# Patient Record
Sex: Female | Born: 1995 | Race: Black or African American | Hispanic: No | Marital: Single | Smoking: Never smoker
Health system: Southern US, Community
[De-identification: ages and names within clinical notes are randomized; demographics above are authoritative.]

## PROBLEM LIST (undated history)

## (undated) ENCOUNTER — Emergency Department (HOSPITAL_COMMUNITY): Admission: EM | Payer: Medicaid Other | Source: Home / Self Care

## (undated) DIAGNOSIS — Z789 Other specified health status: Secondary | ICD-10-CM

## (undated) DIAGNOSIS — Z6835 Body mass index (BMI) 35.0-35.9, adult: Secondary | ICD-10-CM

## (undated) DIAGNOSIS — Z8669 Personal history of other diseases of the nervous system and sense organs: Secondary | ICD-10-CM

## (undated) DIAGNOSIS — G932 Benign intracranial hypertension: Secondary | ICD-10-CM

## (undated) DIAGNOSIS — O139 Gestational [pregnancy-induced] hypertension without significant proteinuria, unspecified trimester: Secondary | ICD-10-CM

## (undated) HISTORY — PX: WISDOM TOOTH EXTRACTION: SHX21

## (undated) HISTORY — DX: Personal history of other diseases of the nervous system and sense organs: Z86.69

## (undated) HISTORY — DX: Body mass index (BMI) 35.0-35.9, adult: Z68.35

---

## 2020-05-24 ENCOUNTER — Emergency Department (HOSPITAL_COMMUNITY)
Admission: EM | Admit: 2020-05-24 | Discharge: 2020-05-24 | Disposition: A | Discharge status: Home / Self Care | Payer: BC Managed Care – PPO | Attending: Emergency Medicine | Admitting: Emergency Medicine

## 2020-05-24 ENCOUNTER — Encounter (HOSPITAL_COMMUNITY): Payer: Self-pay

## 2020-05-24 ENCOUNTER — Other Ambulatory Visit: Payer: Self-pay

## 2020-05-24 DIAGNOSIS — R509 Fever, unspecified: Secondary | ICD-10-CM | POA: Diagnosis present

## 2020-05-24 DIAGNOSIS — U071 COVID-19: Secondary | ICD-10-CM | POA: Insufficient documentation

## 2020-05-24 LAB — CBC WITH DIFFERENTIAL/PLATELET
Abs Immature Granulocytes: 0.02 10*3/uL (ref 0.00–0.07)
Basophils Absolute: 0 10*3/uL (ref 0.0–0.1)
Basophils Relative: 0 %
Eosinophils Absolute: 0 10*3/uL (ref 0.0–0.5)
Eosinophils Relative: 0 %
HCT: 37.6 % (ref 36.0–46.0)
Hemoglobin: 12.1 g/dL (ref 12.0–15.0)
Immature Granulocytes: 0 %
Lymphocytes Relative: 35 %
Lymphs Abs: 2.2 10*3/uL (ref 0.7–4.0)
MCH: 25.9 pg — ABNORMAL LOW (ref 26.0–34.0)
MCHC: 32.2 g/dL (ref 30.0–36.0)
MCV: 80.5 fL (ref 80.0–100.0)
Monocytes Absolute: 1.1 10*3/uL — ABNORMAL HIGH (ref 0.1–1.0)
Monocytes Relative: 17 %
Neutro Abs: 3 10*3/uL (ref 1.7–7.7)
Neutrophils Relative %: 48 %
Platelets: 279 10*3/uL (ref 150–400)
RBC: 4.67 MIL/uL (ref 3.87–5.11)
RDW: 14.2 % (ref 11.5–15.5)
WBC: 6.3 10*3/uL (ref 4.0–10.5)
nRBC: 0 % (ref 0.0–0.2)

## 2020-05-24 LAB — COMPREHENSIVE METABOLIC PANEL
ALT: 21 U/L (ref 0–44)
AST: 24 U/L (ref 15–41)
Albumin: 3.7 g/dL (ref 3.5–5.0)
Alkaline Phosphatase: 64 U/L (ref 38–126)
Anion gap: 9 (ref 5–15)
BUN: 9 mg/dL (ref 6–20)
CO2: 26 mmol/L (ref 22–32)
Calcium: 8.8 mg/dL — ABNORMAL LOW (ref 8.9–10.3)
Chloride: 101 mmol/L (ref 98–111)
Creatinine, Ser: 0.83 mg/dL (ref 0.44–1.00)
GFR, Estimated: >60 mL/min (ref >60)
Glucose, Bld: 92 mg/dL (ref 70–99)
Potassium: 3.7 mmol/L (ref 3.5–5.1)
Sodium: 136 mmol/L (ref 135–145)
Total Bilirubin: 0.5 mg/dL (ref 0.3–1.2)
Total Protein: 6.7 g/dL (ref 6.5–8.1)

## 2020-05-24 LAB — URINALYSIS, ROUTINE W REFLEX MICROSCOPIC
Bilirubin Urine: NEGATIVE
Glucose, UA: NEGATIVE mg/dL
Hgb urine dipstick: NEGATIVE
Ketones, ur: NEGATIVE mg/dL
Leukocytes,Ua: NEGATIVE
Nitrite: NEGATIVE
Protein, ur: NEGATIVE mg/dL
Specific Gravity, Urine: 1.009 (ref 1.005–1.030)
pH: 6 (ref 5.0–8.0)

## 2020-05-24 LAB — LIPASE, BLOOD: Lipase: 39 U/L (ref 11–51)

## 2020-05-24 LAB — RESP PANEL BY RT-PCR (FLU A&B, COVID) ARPGX2
Influenza A by PCR: NEGATIVE
Influenza B by PCR: NEGATIVE
SARS Coronavirus 2 by RT PCR: POSITIVE — AB

## 2020-05-24 LAB — I-STAT BETA HCG BLOOD, ED (MC, WL, AP ONLY): I-stat hCG, quantitative: <5 m[IU]/mL (ref <5)

## 2020-05-24 NOTE — ED Triage Notes (Addendum)
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

## 2020-05-24 NOTE — ED Provider Notes (Signed)
[facility] EMERGENCY DEPARTMENT Provider Note   CSN: [id number] Arrival date & time: 05/24/20  0011     History Chief Complaint  Patient presents with  . Fever  . Diarrhea    Adelis Docter is a 24 y.o. female.  HPI   Patient is a 24 year old female who presents to the emergency department complaining of a cough that started about 2 days ago. Patient reports associated fatigue, body aches, subjective fevers, rhinorrhea, sore throat, diarrhea. She has not been vaccinated for COVID-19. Reports very mild shortness of breath when exerting herself but otherwise denies any shortness of breath at rest. No chest pain. No vomiting or abdominal pain. 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     No past medical history on file.  There are no problems to display for this patient.   OB History   No obstetric history on file.     No family history on file.  Social History   Tobacco Use  . Smoking status: Never Smoker  . Smokeless tobacco: Never Used  Substance Use Topics  . Alcohol use: Never  . Drug use: Never    Home Medications Prior to Admission medications   Not on File    Allergies    Patient has no known allergies.  Review of Systems   Review of Systems  All other systems reviewed and are negative. Ten systems reviewed and are negative for acute change, except as noted in the HPI.   Physical Exam Updated Vital Signs BP 124/79 (BP Location: Left Arm)   Pulse (!) 103   Temp 99.6 F (37.6 C) (Oral)   Resp 18   Ht 5' 3.5" (1.613 m)   Wt 92.5 kg   LMP 05/18/2020   SpO2 100%   BMI 35.57 kg/m   Physical Exam Vitals and nursing note reviewed.  Constitutional:      General: She is not in acute distress.    Appearance: Normal appearance. She is normal weight. She is not ill-appearing, toxic-appearing or diaphoretic.  HENT:     Head: Normocephalic and atraumatic.     Right Ear: External ear normal.     Left Ear: External ear normal.      Nose: Congestion present.     Mouth/Throat:     Mouth: Mucous membranes are moist.     Pharynx: Oropharynx is clear. No oropharyngeal exudate or posterior oropharyngeal erythema.     Comments: Uvula midline. No significant erythema noted in the posterior oropharynx. Readily handling secretions. No hot potato voice. Eyes:     Extraocular Movements: Extraocular movements intact.  Cardiovascular:     Rate and Rhythm: Normal rate and regular rhythm.     Pulses: Normal pulses.     Heart sounds: Normal heart sounds. No murmur heard. No friction rub. No gallop.      Comments: RRR without M/R/G. Pulmonary:     Effort: Pulmonary effort is normal. No respiratory distress.     Breath sounds: Normal breath sounds. No stridor. No wheezing, rhonchi or rales.     Comments: LCTAB Abdominal:     General: Abdomen is flat.     Palpations: Abdomen is soft.     Tenderness: There is no abdominal tenderness.     Comments: Abdomen is soft and nontender in all 4 quadrants.  Musculoskeletal:        General: Normal range of motion.     Cervical back: Normal range of motion and neck supple. No tenderness.  Skin:  General: Skin is warm and dry.  Neurological:     General: No focal deficit present.     Mental Status: She is alert and oriented to person, place, and time.  Psychiatric:        Mood and Affect: Mood normal.        Behavior: Behavior normal.    ED Results / Procedures / Treatments   Labs (all labs ordered are listed, but only abnormal results are displayed) Labs Reviewed  RESP PANEL BY RT-PCR (FLU A&B, COVID) ARPGX2 - Abnormal; Notable for the following components:      Result Value   SARS Coronavirus 2 by RT PCR POSITIVE (*)    All other components within normal limits  COMPREHENSIVE METABOLIC PANEL - Abnormal; Notable for the following components:   Calcium 8.8 (*)    All other components within normal limits  CBC WITH DIFFERENTIAL/PLATELET - Abnormal; Notable for the following  components:   MCH 25.9 (*)    Monocytes Absolute 1.1 (*)    All other components within normal limits  LIPASE, BLOOD  URINALYSIS, ROUTINE W REFLEX MICROSCOPIC  I-STAT BETA HCG BLOOD, ED ([facility], [facility], [facility] ONLY)   EKG None  Radiology No results found.  Procedures Procedures (including critical care time)  Medications Ordered in ED Medications - No data to display  ED Course  I have reviewed the triage vital signs and the nursing notes.  Pertinent labs & imaging results that were available during my care of the patient were reviewed by me and considered in my medical decision making (see chart for details).  Clinical Course as of 05/24/20 0236  Sat May 24, 2020  0223 SARS Coronavirus 2 by RT PCR(!): POSITIVE [LJ]    Clinical Course User Index [LJ] Placido Sou, PA-C   MDM Rules/Calculators/A&P                          Patient is a 24 year old female that presents the emergency department with a multitude of symptoms consistent with COVID-19. She has not been vaccinated for COVID-19. Her test today was positive. She is on day 2 of her symptoms.  Remaining lab work was all obtained in triage and is reassuring. CBC without leukocytosis. No elevation in lipase. UA benign. Electrolytes within normal limits on CMP. Vital signs are stable. She is afebrile as well as not tachycardic. No hypoxia. Normotensive.  Feel that patient is safe for discharge at this time. We discussed quarantining for 10 to 14 days from symptom onset. Tylenol for management of body aches and fever. I have reached out to the MAB clinic regarding the patient and they should be reaching out to her to schedule a time for MAB infusion. Patient is amenable with this plan and verbalized understanding of the above plan. Her questions were answered and she was amicable at the time of discharge.  Final Clinical Impression(s) / ED Diagnoses Final diagnoses:  COVID-19   Rx / DC Orders ED Discharge Orders    None        Placido Sou, PA-C 05/24/20 0236    Mesner, Barbara Cower, MD 05/24/20 [phone]

## 2020-05-24 NOTE — ED Notes (Signed)
Pt denies chest pain. Pt reports being sob on exertion.

## 2020-05-24 NOTE — Discharge Instructions (Signed)
Like we discussed, you can continue take Tylenol for management of your body aches and fevers. Please follow the instructions on the bottle. Do not exceed more than 3000 mg of Tylenol in 1 day.  Please make sure you are staying adequately hydrated. Make sure that you quarantine for 10 to 14 days from onset of your symptoms. I would recommend getting a repeat Covid test to be sure that you were negative before ending your quarantine time.  I have reached out to the monoclonal antibody infusion clinic regarding your case. They will probably reach out to you early next week regarding getting the monoclonal antibodies. I highly recommend that you do so. These been proven to help with the COVID-19 infection.  If you develop worsening symptoms that we discussed, please return to the emergency department for reevaluation. It was a pleasure to meet you.

## 2020-05-24 NOTE — ED Notes (Signed)
Patient verbalized understanding of discharge instructions. Opportunity for questions and answers.  

## 2020-05-25 ENCOUNTER — Encounter: Payer: Self-pay | Admitting: Physician Assistant

## 2020-05-25 ENCOUNTER — Telehealth: Payer: Self-pay | Admitting: Physician Assistant

## 2020-05-25 DIAGNOSIS — Z6835 Body mass index (BMI) 35.0-35.9, adult: Secondary | ICD-10-CM | POA: Insufficient documentation

## 2020-05-25 NOTE — Telephone Encounter (Signed)
Called to discuss with patient about Covid symptoms and the use of sotrovimab, bamlanivimab/etesevimab or casirivimab/imdevimab, a monoclonal antibody infusion for those with mild to moderate Covid symptoms and at a high risk of hospitalization.  Pt is qualified for this infusion at the [facility] infusion center due to; Specific high risk criteria : BMI>25, high SVI. Unvaccinated.    Message left to call back our hotline [phone]. My chart message sent if active on Mychart.   Cline Crock PA-C

## 2020-11-20 ENCOUNTER — Encounter (HOSPITAL_COMMUNITY): Payer: Self-pay | Admitting: Emergency Medicine

## 2020-11-20 ENCOUNTER — Inpatient Hospital Stay (HOSPITAL_COMMUNITY)
Admission: EM | Admit: 2020-11-20 | Discharge: 2020-11-20 | Disposition: A | Discharge status: Home / Self Care | Payer: Medicaid Other | Attending: Family Medicine | Admitting: Family Medicine

## 2020-11-20 ENCOUNTER — Inpatient Hospital Stay (HOSPITAL_COMMUNITY): Payer: Medicaid Other

## 2020-11-20 ENCOUNTER — Other Ambulatory Visit: Payer: Self-pay

## 2020-11-20 DIAGNOSIS — Z3A08 8 weeks gestation of pregnancy: Secondary | ICD-10-CM

## 2020-11-20 DIAGNOSIS — O219 Vomiting of pregnancy, unspecified: Secondary | ICD-10-CM

## 2020-11-20 DIAGNOSIS — O210 Mild hyperemesis gravidarum: Secondary | ICD-10-CM | POA: Diagnosis not present

## 2020-11-20 DIAGNOSIS — R030 Elevated blood-pressure reading, without diagnosis of hypertension: Secondary | ICD-10-CM | POA: Insufficient documentation

## 2020-11-20 DIAGNOSIS — R102 Pelvic and perineal pain: Secondary | ICD-10-CM | POA: Diagnosis not present

## 2020-11-20 DIAGNOSIS — R109 Unspecified abdominal pain: Secondary | ICD-10-CM | POA: Diagnosis not present

## 2020-11-20 DIAGNOSIS — O26891 Other specified pregnancy related conditions, first trimester: Secondary | ICD-10-CM | POA: Insufficient documentation

## 2020-11-20 DIAGNOSIS — O3680X0 Pregnancy with inconclusive fetal viability, not applicable or unspecified: Secondary | ICD-10-CM

## 2020-11-20 LAB — HCG, QUANTITATIVE, PREGNANCY: hCG, Beta Chain, Quant, S: 116913 m[IU]/mL — ABNORMAL HIGH (ref <5)

## 2020-11-20 LAB — CBC
HCT: 39.8 % (ref 36.0–46.0)
Hemoglobin: 12.2 g/dL (ref 12.0–15.0)
MCH: 25.9 pg — ABNORMAL LOW (ref 26.0–34.0)
MCHC: 30.7 g/dL (ref 30.0–36.0)
MCV: 84.5 fL (ref 80.0–100.0)
Platelets: 282 10*3/uL (ref 150–400)
RBC: 4.71 MIL/uL (ref 3.87–5.11)
RDW: 15.3 % (ref 11.5–15.5)
WBC: 15.1 10*3/uL — ABNORMAL HIGH (ref 4.0–10.5)
nRBC: 0 % (ref 0.0–0.2)

## 2020-11-20 LAB — URINALYSIS, ROUTINE W REFLEX MICROSCOPIC
Bilirubin Urine: NEGATIVE
Glucose, UA: NEGATIVE mg/dL
Hgb urine dipstick: NEGATIVE
Ketones, ur: 5 mg/dL — AB
Leukocytes,Ua: NEGATIVE
Nitrite: NEGATIVE
Protein, ur: NEGATIVE mg/dL
Specific Gravity, Urine: 1.021 (ref 1.005–1.030)
pH: 5 (ref 5.0–8.0)

## 2020-11-20 LAB — WET PREP, GENITAL
Sperm: NONE SEEN
Trich, Wet Prep: NONE SEEN
Yeast Wet Prep HPF POC: NONE SEEN

## 2020-11-20 LAB — COMPREHENSIVE METABOLIC PANEL
ALT: 25 U/L (ref 0–44)
AST: 25 U/L (ref 15–41)
Albumin: 3.7 g/dL (ref 3.5–5.0)
Alkaline Phosphatase: 62 U/L (ref 38–126)
Anion gap: 6 (ref 5–15)
BUN: 8 mg/dL (ref 6–20)
CO2: 24 mmol/L (ref 22–32)
Calcium: 9.2 mg/dL (ref 8.9–10.3)
Chloride: 103 mmol/L (ref 98–111)
Creatinine, Ser: 0.61 mg/dL (ref 0.44–1.00)
GFR, Estimated: >60 mL/min (ref >60)
Glucose, Bld: 102 mg/dL — ABNORMAL HIGH (ref 70–99)
Potassium: 3.6 mmol/L (ref 3.5–5.1)
Sodium: 133 mmol/L — ABNORMAL LOW (ref 135–145)
Total Bilirubin: 0.2 mg/dL — ABNORMAL LOW (ref 0.3–1.2)
Total Protein: 6.6 g/dL (ref 6.5–8.1)

## 2020-11-20 LAB — GC/CHLAMYDIA PROBE AMP (~~LOC~~) NOT AT ARMC
Chlamydia: NEGATIVE
Comment: NEGATIVE
Comment: NORMAL
Neisseria Gonorrhea: NEGATIVE

## 2020-11-20 LAB — I-STAT BETA HCG BLOOD, ED (MC, WL, AP ONLY): I-stat hCG, quantitative: >2000 m[IU]/mL — ABNORMAL HIGH (ref <5)

## 2020-11-20 IMAGING — US US OB < 14 WEEKS - US OB TV
1 series · 15 of 28 positions shown · non-contrast
Comparison: None.

CLINICAL DATA: Pelvic pain.  Pregnancy of unknown location.

EXAM:
OBSTETRIC <14 WK US AND TRANSVAGINAL OB US
TECHNIQUE: Both transabdominal and transvaginal ultrasound examinations were
performed for complete evaluation of the gestation as well as the
maternal uterus, adnexal regions, and pelvic cul-de-sac.
Transvaginal technique was performed to assess early pregnancy.

[Series 1: us ob < 14 weeks - us ob tv · 15 of 54 slices shown]
[im 1/54]
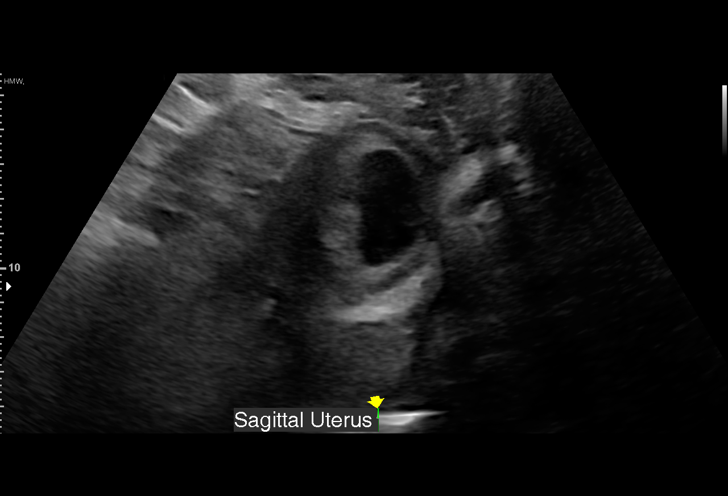
[im 4/54]
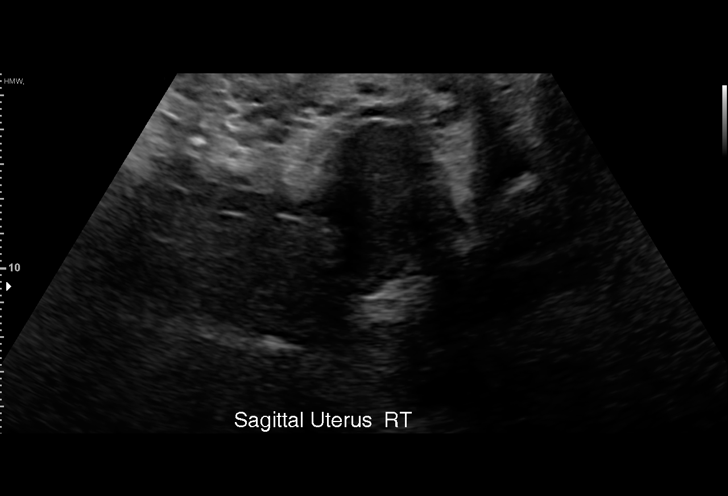
[im 8/54]
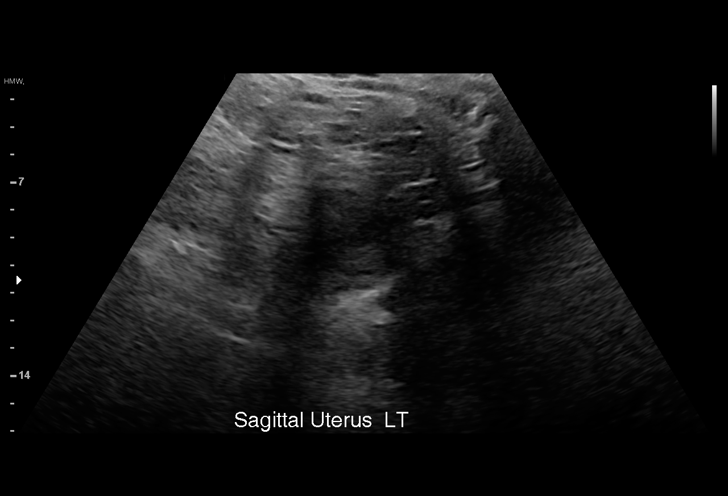
[im 12/54]
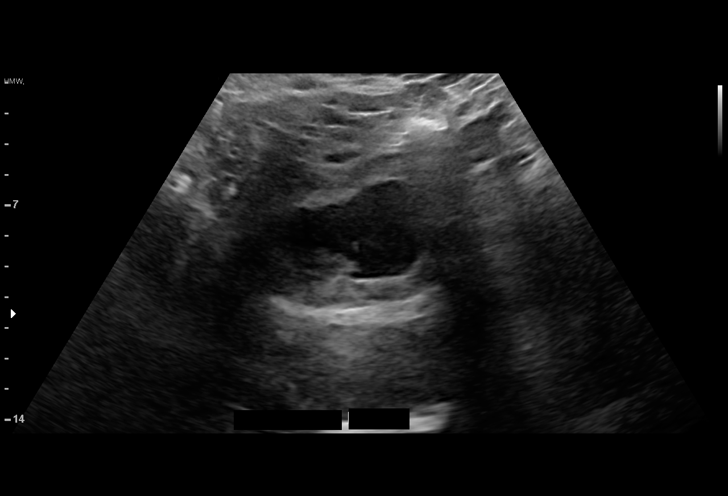
[im 16/54]
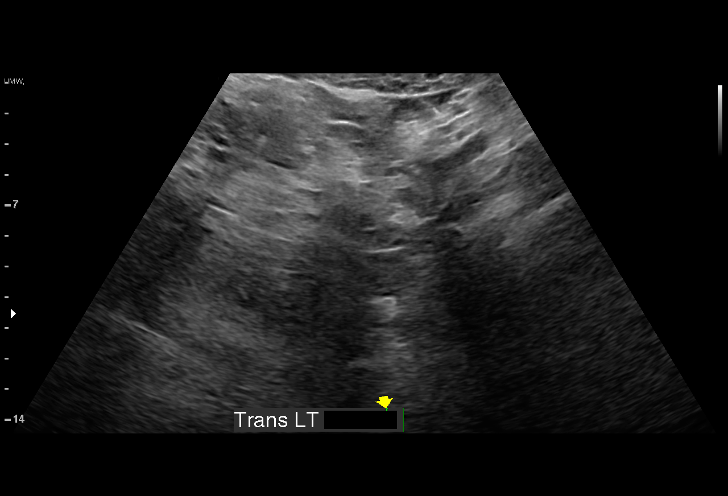
[im 20/54]
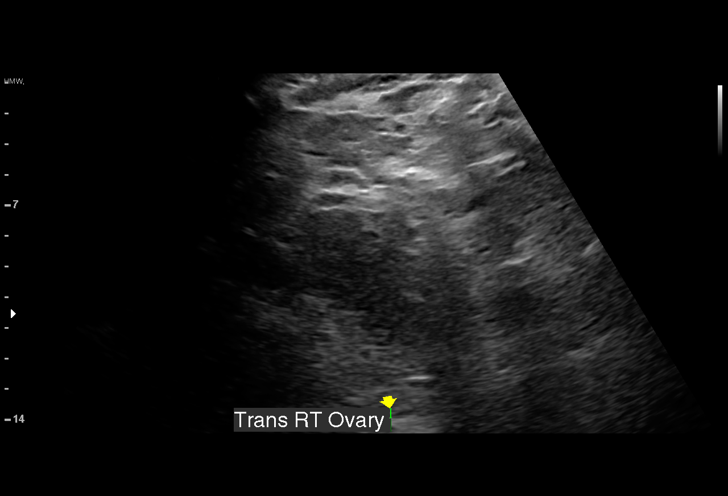
[im 24/54]
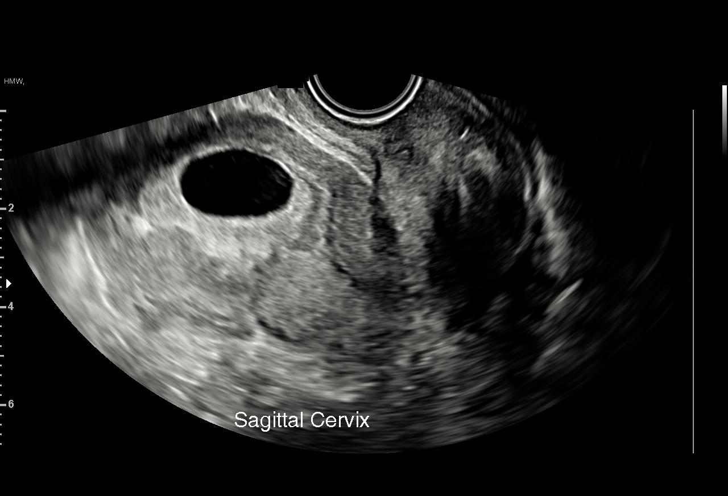
[im 28/54]
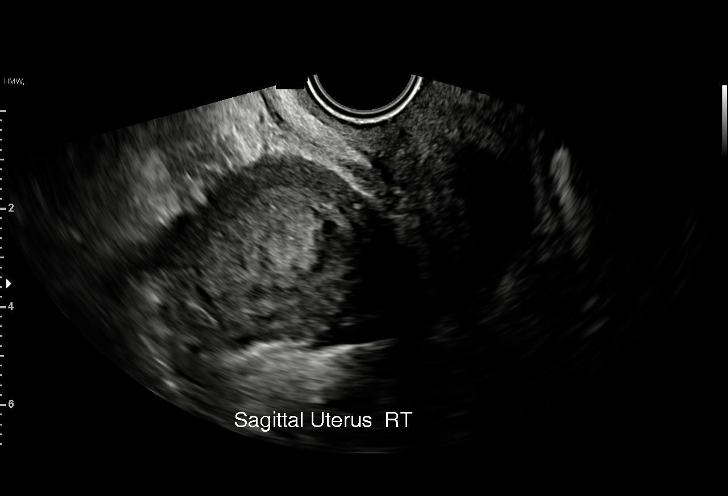
[im 30/54]
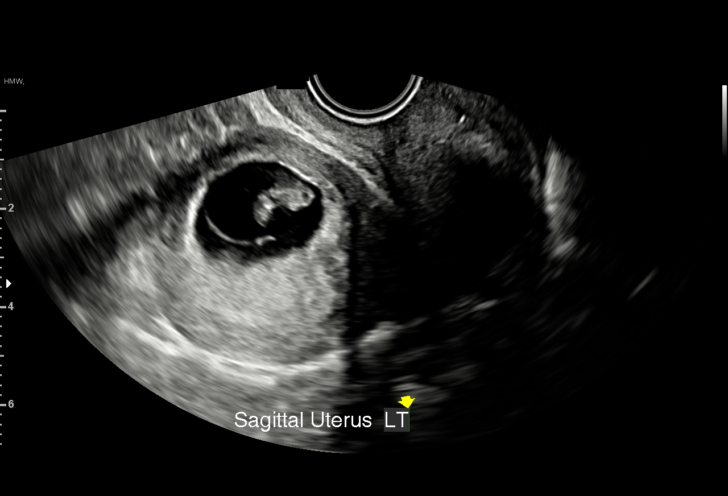
[im 34/54]
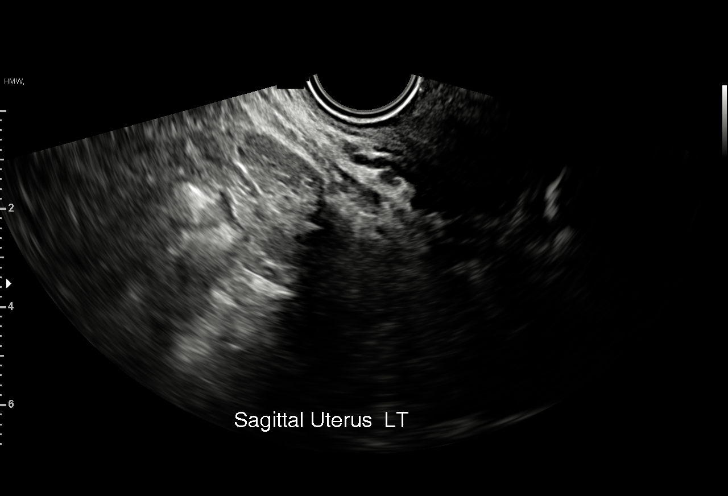
[im 38/54]
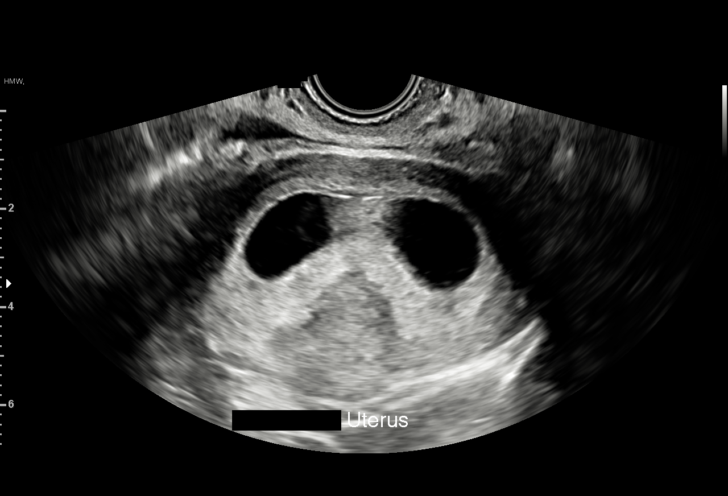
[im 42/54]
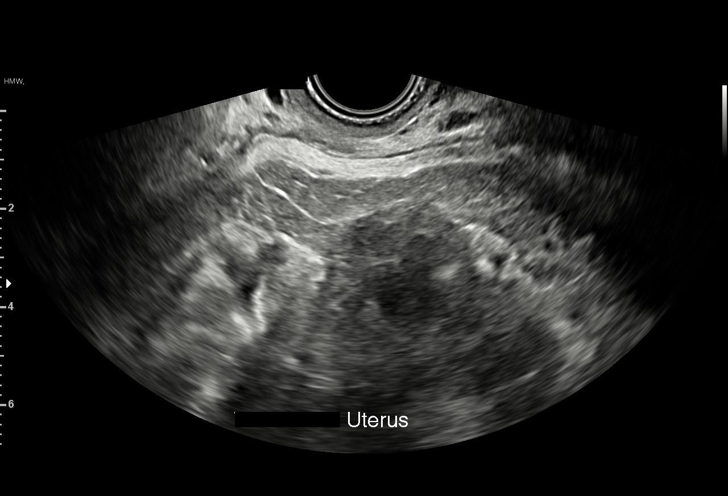
[im 46/54]
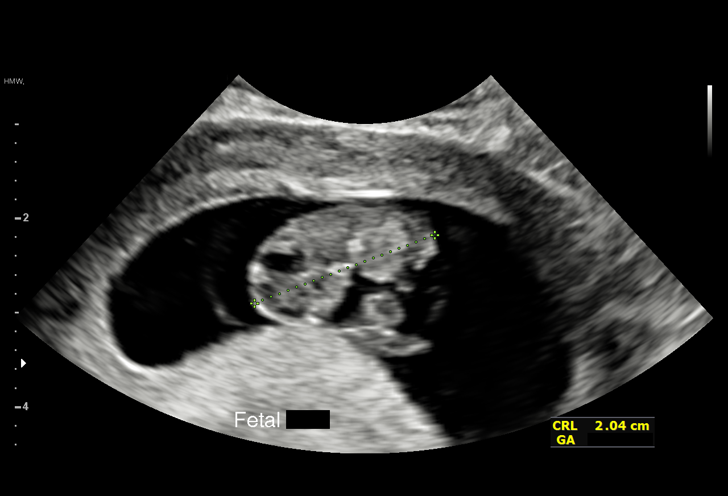
[im 50/54]
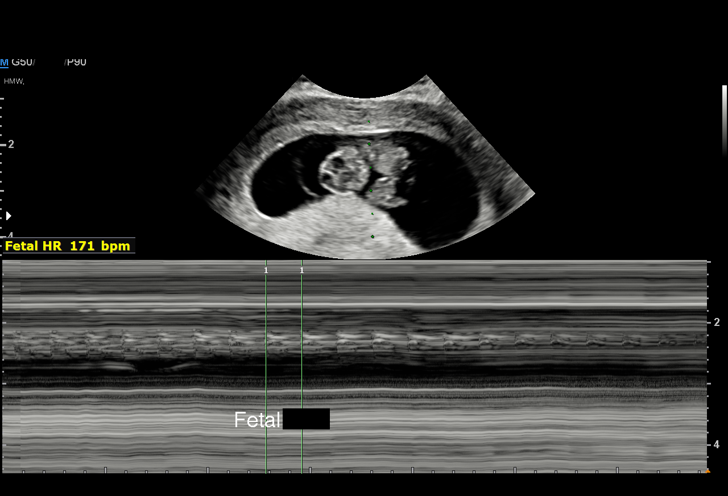
[im 54/54]
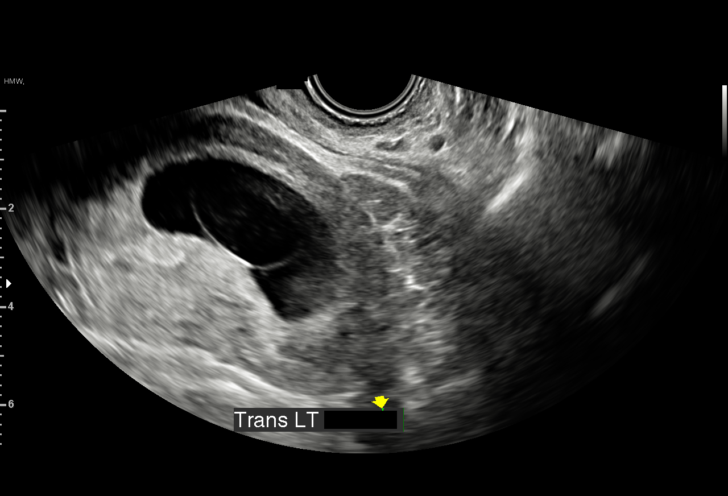

[15 of 28 positions shown; findings below may reference images not displayed]

FINDINGS: Intrauterine gestational sac: Single

Yolk sac:  Visualized.

Embryo:  Visualized.

Cardiac Activity: Visualized.

Heart Rate: 171 bpm

CRL:  20.8 mm   8 w   4 d                  US EDC: [DATE]

Subchorionic hemorrhage:  None visualized.

Maternal uterus/adnexae: Unremarkable
IMPRESSION: Single living intrauterine pregnancy measuring 8 weeks 4 days. No
abnormal finding.

## 2020-11-20 MED ORDER — DOXYLAMINE-PYRIDOXINE 10-10 MG PO TBEC: 2.0000 | DELAYED_RELEASE_TABLET | Freq: Every evening | ORAL | 0 refills | Status: DC | PRN

## 2020-11-20 MED ORDER — ONDANSETRON 4 MG PO TBDP: 4.0000 mg | ORAL_TABLET | Freq: Four times a day (QID) | ORAL | 0 refills | Status: AC | PRN

## 2020-11-20 MED ORDER — ONDANSETRON 4 MG PO TBDP
4.0000 mg | ORAL_TABLET | Freq: Once | ORAL | Status: AC
  Administered 2020-11-20: 4 mg via ORAL
  Filled 2020-11-20: qty 1

## 2020-11-20 NOTE — MAU Note (Signed)
Reports nausea for the past 3 weeks. Has only vomited 4 times in the 3 weeks. Denies vaginal bleeding or leaking of fluid. Occasional abdominal cramping, none now.

## 2020-11-20 NOTE — MAU Provider Note (Signed)
Chief Complaint: Emesis and Nausea   Event Date/Time   First Provider Initiated Contact with Patient 11/20/20 0457        SUBJECTIVE HPI: Tracy Keller is a 25 y.o. G1P0 at [redacted]w[redacted]d by LMP who presents to maternity admissions reporting nausea for several weeks.  Has not vomited much and can keep down fluids and "dry food".  Has intermittent pelvic cramping, alternating sides. . She denies vaginal bleeding, vaginal itching/burning, urinary symptoms, h/a, dizziness, or fever/chills.    Emesis  This is a recurrent problem. The current episode started 1 to 4 weeks ago. The problem occurs intermittently. The problem has been unchanged. There has been no fever. Associated symptoms include abdominal pain. Pertinent negatives include no chest pain, chills, diarrhea, dizziness, fever, headaches, myalgias or weight loss. She has tried nothing for the symptoms.  Abdominal Pain This is a recurrent problem. The current episode started 1 to 4 weeks ago. The onset quality is gradual. The problem occurs intermittently. The problem is unchanged. The pain is located in the suprapubic region, LLQ and RLQ. The pain is mild. The quality of the pain is described as cramping. The pain does not radiate. Associated symptoms include vomiting. Pertinent negatives include no constipation, diarrhea, fever, frequency, headaches or myalgias. Nothing relieves the symptoms. Past treatments include nothing.   RN Note: Reports nausea for the past 3 weeks. Has only vomited 4 times in the 3 weeks. Denies vaginal bleeding or leaking of fluid. Occasional abdominal cramping, none now.   Past Medical History:  Diagnosis Date   BMI 35.0-35.9,adult    History reviewed. No pertinent surgical history. Social History   Socioeconomic History   Marital status: Single    Spouse name: Not on file   Number of children: Not on file   Years of education: Not on file   Highest education level: Not on file  Occupational History   Not on file   Tobacco Use   Smoking status: Never   Smokeless tobacco: Never  Substance and Sexual Activity   Alcohol use: Never   Drug use: Never   Sexual activity: Never  Other Topics Concern   Not on file  Social History Narrative   Not on file   Social Determinants of Health   Financial Resource Strain: Not on file  Food Insecurity: Not on file  Transportation Needs: Not on file  Physical Activity: Not on file  Stress: Not on file  Social Connections: Not on file  Intimate Partner Violence: Not on file   No current facility-administered medications on file prior to encounter.   Current Outpatient Medications on File Prior to Encounter  Medication Sig Dispense Refill   Prenatal Vit-Fe Fumarate-FA (PRENATAL MULTIVITAMIN) TABS tablet Take 1 tablet by mouth daily at 12 noon.     Ascorbic Acid (VITAMIN C ADULT GUMMIES PO) Take 1 tablet by mouth daily.     guaiFENesin (MUCINEX) 600 MG 12 hr tablet Take 600 mg by mouth 2 (two) times daily as needed for cough.     Norgestimate-Ethinyl Estradiol Triphasic 0.18/0.215/0.25 MG-35 MCG tablet Take 1 tablet by mouth daily.     No Known Allergies  I have reviewed patient's Past Medical Hx, Surgical Hx, Family Hx, Social Hx, medications and allergies.   ROS:  Review of Systems  Constitutional:  Negative for chills, fever and weight loss.  Cardiovascular:  Negative for chest pain.  Gastrointestinal:  Positive for abdominal pain and vomiting. Negative for constipation and diarrhea.  Genitourinary:  Negative for frequency.  Musculoskeletal:  Negative for myalgias.  Neurological:  Negative for dizziness and headaches.  Review of Systems  Other systems negative   Physical Exam  Physical Exam Patient Vitals for the past 24 hrs:  BP Temp Temp src Pulse Resp SpO2 Height Weight  11/20/20 0445 121/66 99 F (37.2 C) Oral 91 17 100 % 5\' 3"  (1.6 m) 102 kg  11/20/20 0444 -- -- -- -- -- 98 % -- --  11/20/20 0402 (!) 141/80 99.1 F (37.3 C) Oral 92 16  100 % -- --   Constitutional: Well-developed, well-nourished female in no acute distress.  Cardiovascular: normal rate Respiratory: normal effort GI: Abd soft, non-tender. No rebound or guarding MS: Extremities nontender, no edema, normal ROM Neurologic: Alert and oriented x 4.  GU: Neg CVAT.  PELVIC EXAM: nontender over bilateral pelvic areas.  Bimanual deferred for 11/22/20 ordered  LAB RESULTS Results for orders placed or performed during the hospital encounter of 11/20/20 (from the past 24 hour(s))  I-Stat beta hCG blood, ED     Status: Abnormal   Collection Time: 11/20/20  4:14 AM  Result Value Ref Range   I-stat hCG, quantitative >2,000.0 (H) <5 mIU/mL   Comment 3          Wet prep, genital     Status: Abnormal   Collection Time: 11/20/20  5:31 AM   Specimen: Urine, Clean Catch  Result Value Ref Range   Yeast Wet Prep HPF POC NONE SEEN NONE SEEN   Trich, Wet Prep NONE SEEN NONE SEEN   Clue Cells Wet Prep HPF POC PRESENT (A) NONE SEEN   WBC, Wet Prep HPF POC MANY (A) NONE SEEN   Sperm NONE SEEN   CBC     Status: Abnormal   Collection Time: 11/20/20  5:41 AM  Result Value Ref Range   WBC 15.1 (H) 4.0 - 10.5 K/uL   RBC 4.71 3.87 - 5.11 MIL/uL   Hemoglobin 12.2 12.0 - 15.0 g/dL   HCT 11/22/20 06.2 - 69.4 %   MCV 84.5 80.0 - 100.0 fL   MCH 25.9 (L) 26.0 - 34.0 pg   MCHC 30.7 30.0 - 36.0 g/dL   RDW 85.4 62.7 - 03.5 %   Platelets 282 150 - 400 K/uL   nRBC 0.0 0.0 - 0.2 %  Urinalysis, Routine w reflex microscopic Urine, Clean Catch     Status: Abnormal   Collection Time: 11/20/20  5:51 AM  Result Value Ref Range   Color, Urine YELLOW YELLOW   APPearance CLEAR CLEAR   Specific Gravity, Urine 1.021 1.005 - 1.030   pH 5.0 5.0 - 8.0   Glucose, UA NEGATIVE NEGATIVE mg/dL   Hgb urine dipstick NEGATIVE NEGATIVE   Bilirubin Urine NEGATIVE NEGATIVE   Ketones, ur 5 (A) NEGATIVE mg/dL   Protein, ur NEGATIVE NEGATIVE mg/dL   Nitrite NEGATIVE NEGATIVE   Leukocytes,Ua NEGATIVE NEGATIVE        IMAGING 11/22/20 OB LESS THAN 14 WEEKS WITH OB TRANSVAGINAL  Result Date: 11/20/2020 CLINICAL DATA:  Pelvic pain.  Pregnancy of unknown location. EXAM: OBSTETRIC <14 WK 11/22/2020 AND TRANSVAGINAL OB US TECHNIQUE: Both transabdominal and transvaginal ultrasound examinations were performed for complete evaluation of the gestation as well as the maternal uterus, adnexal regions, and pelvic cul-de-sac. Transvaginal technique was performed to assess early pregnancy. COMPARISON:  None. FINDINGS: Intrauterine gestational sac: Single Yolk sac:  Visualized. Embryo:  Visualized. Cardiac Activity: Visualized. Heart Rate: 171 bpm CRL:  20.8 mm   8 w  4 d                  [place] EDC: 06/28/2021 Subchorionic hemorrhage:  None visualized. Maternal uterus/adnexae: Unremarkable IMPRESSION: Single living intrauterine pregnancy measuring 8 weeks 4 days. No abnormal finding. Electronically Signed   By: Marnee Spring M.D.   On: 11/20/2020 05:55     MAU Management/MDM: Ordered usual first trimester r/o ectopic labs.   Pelvic cultures done Will check baseline Ultrasound to rule out ectopic.  This bleeding/pain can represent a normal pregnancy with bleeding, spontaneous abortion or even an ectopic which can be life-threatening.  The process as listed above helps to determine which of these is present.  Zofran given for nausea Since she has only vomited 4 times in past 3 weeks, no evidence for dehydration Reviewed results.  Normal Single IUP noted, size = dates  ASSESSMENT Pregnancy at [redacted]w[redacted]d Nausea and occasional vomiting Intermittent pelvic pain Borderline hypertension, likely chronic  PLAN Discharge home Rx Diclegis for nausea Rx Zofran only if diclegis does not work Keep appt at [facility]   Pt stable at time of discharge. Encouraged to return here if she develops worsening of symptoms, increase in pain, fever, or other concerning symptoms.    Wynelle Bourgeois CNM, MSN Certified Nurse-Midwife 11/20/2020  4:58  AM

## 2020-11-20 NOTE — ED Triage Notes (Signed)
Patient [redacted] weeks pregnant per health dept test, having 3 weeks of nausea and vomiting with some mild abdominal cramping.  No vaginal bleeding or discharge.

## 2020-11-20 NOTE — ED Provider Notes (Signed)
MSE was initiated and I personally evaluated the patient and placed orders (if any) at  4:07 AM on November 20, 2020.  Patient reports she is 8 weeks into her first pregnancy, confirmed by [facility] Department. First OB/GYN appt. July 1st. Reports vomiting for the past 3 weeks. No vaginal bleeding. She has lower abdominal cramping "every now and then". No fever.  Today's Vitals   11/20/20 0402 11/20/20 0407  BP: (!) 141/80   Pulse: 92   Resp: 16   Temp: 99.1 F (37.3 C)   TempSrc: Oral   SpO2: 100%   PainSc:  1    There is no height or weight on file to calculate BMI.  Patient in NAD Abd nontender  Discussed with MAU provider who accepts the patient for transfer.   The patient appears stable so that the remainder of the MSE may be completed by another provider.   Elpidio Anis, PA-C 11/20/20 0427    Dione Booze, MD 11/20/20 [phone]

## 2020-12-02 LAB — OB RESULTS CONSOLE HEPATITIS B SURFACE ANTIGEN: Hepatitis B Surface Ag: NEGATIVE

## 2020-12-02 LAB — OB RESULTS CONSOLE ANTIBODY SCREEN: Antibody Screen: NEGATIVE

## 2020-12-02 LAB — OB RESULTS CONSOLE GC/CHLAMYDIA
Chlamydia: NEGATIVE
Gonorrhea: NEGATIVE

## 2020-12-02 LAB — OB RESULTS CONSOLE ABO/RH: RH Type: NEGATIVE

## 2020-12-02 LAB — OB RESULTS CONSOLE HIV ANTIBODY (ROUTINE TESTING): HIV: NONREACTIVE

## 2020-12-02 LAB — OB RESULTS CONSOLE RPR: RPR: NONREACTIVE

## 2020-12-02 LAB — OB RESULTS CONSOLE RUBELLA ANTIBODY, IGM: Rubella: IMMUNE

## 2020-12-02 LAB — HEPATITIS C ANTIBODY: HCV Ab: NEGATIVE

## 2020-12-17 ENCOUNTER — Inpatient Hospital Stay (HOSPITAL_COMMUNITY)
Admission: AD | Admit: 2020-12-17 | Discharge: 2020-12-17 | Disposition: A | Discharge status: Home / Self Care | Payer: BC Managed Care – PPO | Attending: Obstetrics and Gynecology | Admitting: Obstetrics and Gynecology

## 2020-12-17 ENCOUNTER — Other Ambulatory Visit: Payer: Self-pay

## 2020-12-17 ENCOUNTER — Encounter (HOSPITAL_COMMUNITY): Payer: Self-pay | Admitting: Obstetrics and Gynecology

## 2020-12-17 DIAGNOSIS — O26891 Other specified pregnancy related conditions, first trimester: Secondary | ICD-10-CM

## 2020-12-17 DIAGNOSIS — Z3A12 12 weeks gestation of pregnancy: Secondary | ICD-10-CM

## 2020-12-17 DIAGNOSIS — Z79899 Other long term (current) drug therapy: Secondary | ICD-10-CM | POA: Insufficient documentation

## 2020-12-17 DIAGNOSIS — R109 Unspecified abdominal pain: Secondary | ICD-10-CM | POA: Diagnosis not present

## 2020-12-17 LAB — URINALYSIS, ROUTINE W REFLEX MICROSCOPIC
Bilirubin Urine: NEGATIVE
Glucose, UA: NEGATIVE mg/dL
Hgb urine dipstick: NEGATIVE
Ketones, ur: NEGATIVE mg/dL
Leukocytes,Ua: NEGATIVE
Nitrite: NEGATIVE
Protein, ur: NEGATIVE mg/dL
Specific Gravity, Urine: 1.025 (ref 1.005–1.030)
pH: 6 (ref 5.0–8.0)

## 2020-12-17 NOTE — MAU Provider Note (Signed)
Chief Complaint: Abdominal Cramping   Event Date/Time   First Provider Initiated Contact with Patient 12/17/20 0453      SUBJECTIVE HPI: Tracy Keller is a 25 y.o. G1P0 at [redacted]w[redacted]d by LMP c/w early [place] at [facility] per pt who presents to maternity admissions reporting cramping.  She reports the pain is less than menstrual cramping but it is persistent.  There are no other associated symptoms.     Location: lower abdomen Quality: cramping Severity: 3-4/10 on pain scale Duration: 1 day Timing: intermittent Modifying factors: none Associated signs and symptoms: none  HPI  Past Medical History:  Diagnosis Date   BMI 35.0-35.9,adult    History reviewed. No pertinent surgical history. Social History   Socioeconomic History   Marital status: Single    Spouse name: Not on file   Number of children: Not on file   Years of education: Not on file   Highest education level: Not on file  Occupational History   Not on file  Tobacco Use   Smoking status: Never   Smokeless tobacco: Never  Substance and Sexual Activity   Alcohol use: Never   Drug use: Never   Sexual activity: Yes  Other Topics Concern   Not on file  Social History Narrative   Not on file   Social Determinants of Health   Financial Resource Strain: Not on file  Food Insecurity: Not on file  Transportation Needs: Not on file  Physical Activity: Not on file  Stress: Not on file  Social Connections: Not on file  Intimate Partner Violence: Not on file   No current facility-administered medications on file prior to encounter.   Current Outpatient Medications on File Prior to Encounter  Medication Sig Dispense Refill   Prenatal Vit-Fe Fumarate-FA (PRENATAL MULTIVITAMIN) TABS tablet Take 1 tablet by mouth daily at 12 noon.     Ascorbic Acid (VITAMIN C ADULT GUMMIES PO) Take 1 tablet by mouth daily.     Doxylamine-Pyridoxine (DICLEGIS) 10-10 MG TBEC Take 2 tablets by mouth at bedtime as needed (nausea). 60 tablet 0    ondansetron (ZOFRAN ODT) 4 MG disintegrating tablet Take 1 tablet (4 mg total) by mouth every 6 (six) hours as needed for nausea. 20 tablet 0   No Known Allergies  ROS:  Review of Systems  Constitutional:  Negative for chills, fatigue and fever.  Respiratory:  Negative for shortness of breath.   Cardiovascular:  Negative for chest pain.  Gastrointestinal:  Positive for abdominal pain. Negative for nausea and vomiting.  Genitourinary:  Negative for difficulty urinating, dysuria, flank pain, pelvic pain, vaginal bleeding, vaginal discharge and vaginal pain.  Neurological:  Negative for dizziness and headaches.  Psychiatric/Behavioral: Negative.      I have reviewed patient's Past Medical Hx, Surgical Hx, Family Hx, Social Hx, medications and allergies.   Physical Exam  Patient Vitals for the past 24 hrs:  BP Temp Temp src Pulse Resp SpO2 Height Weight  12/17/20 0510 (!) 143/60 -- -- (!) 111 18 -- -- --  12/17/20 0323 139/78 98.5 F (36.9 C) Oral 99 18 99 % 5\' 3"  (1.6 m) 104.4 kg   Constitutional: Well-developed, well-nourished female in no acute distress.  Cardiovascular: normal rate Respiratory: normal effort GI: Abd soft, non-tender. Pos BS x 4 MS: Extremities nontender, no edema, normal ROM Neurologic: Alert and oriented x 4.  GU: Neg CVAT.  PELVIC EXAM: Cervix pink, visually closed, without lesion, scant white creamy discharge, vaginal walls and external genitalia normal Bimanual exam:  Cervix 0/long/high, firm, anterior, neg CMT, uterus nontender, nonenlarged, adnexa without tenderness, enlargement, or mass  FHT not heard by doppler  LAB RESULTS Results for orders placed or performed during the hospital encounter of 12/17/20 (from the past 24 hour(s))  Urinalysis, Routine w reflex microscopic     Status: None   Collection Time: 12/17/20  5:12 AM  Result Value Ref Range   Color, Urine YELLOW YELLOW   APPearance CLEAR CLEAR   Specific Gravity, Urine 1.025 1.005 - 1.030    pH 6.0 5.0 - 8.0   Glucose, UA NEGATIVE NEGATIVE mg/dL   Hgb urine dipstick NEGATIVE NEGATIVE   Bilirubin Urine NEGATIVE NEGATIVE   Ketones, ur NEGATIVE NEGATIVE mg/dL   Protein, ur NEGATIVE NEGATIVE mg/dL   Nitrite NEGATIVE NEGATIVE   Leukocytes,Ua NEGATIVE NEGATIVE       IMAGING  Limited OB US Date: 12/17/20 EDD :  06/28/21 based on LMP Viability:  FHT detected CRL measurement c/w previous dates Fetal position: variable  Pt informed that the ultrasound is considered a limited OB ultrasound and is not intended to be a complete ultrasound exam.  Patient also informed that the ultrasound is not being completed with the intent of assessing for fetal or placental anomalies or any pelvic abnormalities.  Explained that the purpose of today's ultrasound is to assess for  viability.  Patient acknowledges the purpose of the exam and the limitations of the study.      US OB LESS THAN 14 WEEKS WITH OB TRANSVAGINAL  Result Date: 11/20/2020 CLINICAL DATA:  Pelvic pain.  Pregnancy of unknown location. EXAM: OBSTETRIC <14 WK [place] AND TRANSVAGINAL OB US TECHNIQUE: Both transabdominal and transvaginal ultrasound examinations were performed for complete evaluation of the gestation as well as the maternal uterus, adnexal regions, and pelvic cul-de-sac. Transvaginal technique was performed to assess early pregnancy. COMPARISON:  None. FINDINGS: Intrauterine gestational sac: Single Yolk sac:  Visualized. Embryo:  Visualized. Cardiac Activity: Visualized. Heart Rate: 171 bpm CRL:  20.8 mm   8 w   4 d                  [place] EDC: 06/28/2021 Subchorionic hemorrhage:  None visualized. Maternal uterus/adnexae: Unremarkable IMPRESSION: Single living intrauterine pregnancy measuring 8 weeks 4 days. No abnormal finding. Electronically Signed   By: Marnee Spring M.D.   On: 11/20/2020 05:55    MAU Management/MDM: Orders Placed This Encounter  Procedures   Culture, OB Urine   Urinalysis, Routine w reflex microscopic    Discharge patient    No orders of the defined types were placed in this encounter.   Bedside US reveals active fetus, no abnormalities visualized.  Pain minimal, pt just wanted to check on pregnancy. UA wnl.  Pt reassured.  Reviewed reasons to return. Pt to keep appts with [facility].    ASSESSMENT 1. Abdominal pain during pregnancy in first trimester   2. [redacted] weeks gestation of pregnancy     PLAN Discharge home Allergies as of 12/17/2020   No Known Allergies      Medication List     TAKE these medications    Doxylamine-Pyridoxine 10-10 MG Tbec Commonly known as: Diclegis Take 2 tablets by mouth at bedtime as needed (nausea).   ondansetron 4 MG disintegrating tablet Commonly known as: Zofran ODT Take 1 tablet (4 mg total) by mouth every 6 (six) hours as needed for nausea.   prenatal multivitamin Tabs tablet Take 1 tablet by mouth daily at 12 noon.   VITAMIN  C ADULT GUMMIES PO Take 1 tablet by mouth daily.        Follow-up Information     [facility] Follow up.   Why: As scheduled Contact information: [address] [place] [address] [phone]        [facility] Follow up.   Specialty: Obstetrics and Gynecology Why: As needed for emergencies Contact information: [address] [id number] Wilhemina Bonito [place] [address] [phone]                Sharen Counter Certified Nurse-Midwife 12/17/2020  5:52 AM

## 2020-12-17 NOTE — MAU Note (Signed)
Pt presents to MAU c/o 4/10 abdominal cramping beginning tonight around 0215 am.  Pt denies vaginal bleeding, itching, discharge or odor.  Pt states that she had an OB visit on 12/02/2020 and IUP was confirmed.  Denies any other concerns at this time.  Vitals: Pain: 4/10 BP: 139/78 HR: 99 RR: 18 SpO2: 100% Temp: 98.5 F

## 2020-12-18 LAB — CULTURE, OB URINE: Culture: >=100000 — AB

## 2020-12-28 ENCOUNTER — Other Ambulatory Visit: Payer: Self-pay

## 2020-12-28 ENCOUNTER — Inpatient Hospital Stay (HOSPITAL_COMMUNITY)
Admission: AD | Admit: 2020-12-28 | Discharge: 2020-12-28 | Disposition: A | Discharge status: Home / Self Care | Payer: BC Managed Care – PPO | Attending: Obstetrics & Gynecology | Admitting: Obstetrics & Gynecology

## 2020-12-28 DIAGNOSIS — O26892 Other specified pregnancy related conditions, second trimester: Secondary | ICD-10-CM

## 2020-12-28 DIAGNOSIS — R8290 Unspecified abnormal findings in urine: Secondary | ICD-10-CM | POA: Diagnosis present

## 2020-12-28 DIAGNOSIS — Z3A14 14 weeks gestation of pregnancy: Secondary | ICD-10-CM | POA: Diagnosis not present

## 2020-12-28 LAB — URINALYSIS, ROUTINE W REFLEX MICROSCOPIC
Bilirubin Urine: NEGATIVE
Glucose, UA: NEGATIVE mg/dL
Hgb urine dipstick: NEGATIVE
Ketones, ur: 20 mg/dL — AB
Leukocytes,Ua: NEGATIVE
Nitrite: NEGATIVE
Protein, ur: 30 mg/dL — AB
Specific Gravity, Urine: 1.03 (ref 1.005–1.030)
pH: 5 (ref 5.0–8.0)

## 2020-12-28 NOTE — MAU Note (Signed)
Tracy Keller is a 25 y.o. at [redacted]w[redacted]d here in MAU reporting: Friday night after eating seafood noticed her urine had a strong smell. Having some burning with urination. Some increased frequency with occasional urgency. Had 1 episode pressure. No bleeding. No pain. No LOF or abnormal.   Onset of complaint: on Friday  Pain score: 0/10  Vitals:   12/28/20 1156  BP: 127/66  Pulse: (!) 103  Resp: 16  Temp: 98.4 F (36.9 C)  SpO2: 97%     FHT:158  Lab orders placed from triage: UA

## 2020-12-28 NOTE — MAU Provider Note (Signed)
S [name] is a 25 y.o. G1P0 patient who presents to MAU today with complaint of urinary frequency. She states she ate seafood Friday and noticed that it made her urine smell bad. She states she noticed she is urinating more often and sometimes it burns. She reports she had one episode of feeling pressure. She reports all the symptoms are improving. She denies any bleeding or abdominal pain.   O BP 127/66 (BP Location: Right Arm)   Pulse (!) 103   Temp 98.4 F (36.9 C) (Oral)   Resp 16   LMP 09/21/2020   SpO2 97% Comment: room air Physical Exam Vitals and nursing note reviewed.  Constitutional:      General: She is not in acute distress.    Appearance: She is well-developed.  HENT:     Head: Normocephalic.  Eyes:     Pupils: Pupils are equal, round, and reactive to light.  Cardiovascular:     Rate and Rhythm: Normal rate and regular rhythm.     Heart sounds: Normal heart sounds.  Pulmonary:     Effort: Pulmonary effort is normal. No respiratory distress.     Breath sounds: Normal breath sounds.  Abdominal:     General: Bowel sounds are normal. There is no distension.     Palpations: Abdomen is soft.     Tenderness: There is no abdominal tenderness.  Skin:    General: Skin is warm and dry.  Neurological:     Mental Status: She is alert and oriented to person, place, and time.  Psychiatric:        Mood and Affect: Mood normal.        Behavior: Behavior normal.        Thought Content: Thought content normal.        Judgment: Judgment normal.   Results for orders placed or performed during the hospital encounter of 12/28/20 (from the past 24 hour(s))  Urinalysis, Routine w reflex microscopic     Status: Abnormal   Collection Time: 12/28/20 12:21 PM  Result Value Ref Range   Color, Urine AMBER (A) YELLOW   APPearance HAZY (A) CLEAR   Specific Gravity, Urine 1.030 1.005 - 1.030   pH 5.0 5.0 - 8.0   Glucose, UA NEGATIVE NEGATIVE mg/dL   Hgb urine dipstick NEGATIVE  NEGATIVE   Bilirubin Urine NEGATIVE NEGATIVE   Ketones, ur 20 (A) NEGATIVE mg/dL   Protein, ur 30 (A) NEGATIVE mg/dL   Nitrite NEGATIVE NEGATIVE   Leukocytes,Ua NEGATIVE NEGATIVE   RBC / HPF 0-5 0 - 5 RBC/hpf   WBC, UA 0-5 0 - 5 WBC/hpf   Bacteria, UA RARE (A) NONE SEEN   Squamous Epithelial / LPF 11-20 0 - 5   Mucus PRESENT      A Medical screening exam complete 1. Foul smelling urine   2. [redacted] weeks gestation of pregnancy    UA, UC Results reviewed and reassured patient that symptoms improving is a good sign  P Discharge from MAU in stable condition Will follow up on the urine culture and treat if needed Warning signs for worsening condition that would warrant emergency follow-up discussed Patient may return to MAU as needed   Rolm Bookbinder, [place] 12/28/2020 1:02 PM

## 2020-12-28 NOTE — Discharge Instructions (Signed)

## 2020-12-29 LAB — CULTURE, OB URINE

## 2021-01-16 ENCOUNTER — Inpatient Hospital Stay (HOSPITAL_COMMUNITY)
Admission: AD | Admit: 2021-01-16 | Discharge: 2021-01-16 | Disposition: A | Discharge status: Home / Self Care | Payer: BC Managed Care – PPO | Attending: Obstetrics & Gynecology | Admitting: Obstetrics & Gynecology

## 2021-01-16 ENCOUNTER — Encounter (HOSPITAL_COMMUNITY): Payer: Self-pay | Admitting: Internal Medicine

## 2021-01-16 ENCOUNTER — Other Ambulatory Visit: Payer: Self-pay

## 2021-01-16 DIAGNOSIS — K5900 Constipation, unspecified: Secondary | ICD-10-CM | POA: Insufficient documentation

## 2021-01-16 DIAGNOSIS — O99612 Diseases of the digestive system complicating pregnancy, second trimester: Secondary | ICD-10-CM | POA: Diagnosis present

## 2021-01-16 DIAGNOSIS — B9689 Other specified bacterial agents as the cause of diseases classified elsewhere: Secondary | ICD-10-CM | POA: Insufficient documentation

## 2021-01-16 DIAGNOSIS — Z3A16 16 weeks gestation of pregnancy: Secondary | ICD-10-CM | POA: Diagnosis not present

## 2021-01-16 DIAGNOSIS — R109 Unspecified abdominal pain: Secondary | ICD-10-CM | POA: Diagnosis not present

## 2021-01-16 DIAGNOSIS — O26892 Other specified pregnancy related conditions, second trimester: Secondary | ICD-10-CM | POA: Diagnosis not present

## 2021-01-16 DIAGNOSIS — N760 Acute vaginitis: Secondary | ICD-10-CM | POA: Insufficient documentation

## 2021-01-16 DIAGNOSIS — O98812 Other maternal infectious and parasitic diseases complicating pregnancy, second trimester: Secondary | ICD-10-CM

## 2021-01-16 DIAGNOSIS — B373 Candidiasis of vulva and vagina: Secondary | ICD-10-CM

## 2021-01-16 HISTORY — DX: Other specified health status: Z78.9

## 2021-01-16 LAB — URINALYSIS, ROUTINE W REFLEX MICROSCOPIC
Bilirubin Urine: NEGATIVE
Glucose, UA: NEGATIVE mg/dL
Hgb urine dipstick: NEGATIVE
Ketones, ur: NEGATIVE mg/dL
Leukocytes,Ua: NEGATIVE
Nitrite: NEGATIVE
Protein, ur: NEGATIVE mg/dL
Specific Gravity, Urine: 1.027 (ref 1.005–1.030)
pH: 6 (ref 5.0–8.0)

## 2021-01-16 LAB — WET PREP, GENITAL
Sperm: NONE SEEN
Trich, Wet Prep: NONE SEEN
Yeast Wet Prep HPF POC: NONE SEEN

## 2021-01-16 MED ORDER — METRONIDAZOLE 500 MG PO TABS: 500.0000 mg | ORAL_TABLET | Freq: Two times a day (BID) | ORAL | 0 refills | Status: DC

## 2021-01-16 MED ORDER — DOCUSATE SODIUM 100 MG PO CAPS: 100.0000 mg | ORAL_CAPSULE | Freq: Two times a day (BID) | ORAL | 2 refills | Status: DC | PRN

## 2021-01-16 NOTE — Discharge Instructions (Signed)

## 2021-01-16 NOTE — MAU Note (Signed)
Pt been having abdominal pain and cramping since last night. No bleeding or LOF. Unsure if it's gas, constipation or the pregnancy

## 2021-01-16 NOTE — MAU Provider Note (Signed)
History     CSN: [id number]  Arrival date and time: 01/16/21 2140   Event Date/Time   First Provider Initiated Contact with Patient 01/16/21 2251      Chief Complaint  Patient presents with   Abdominal Pain   HPI Tracy Keller is a 25 y.o. G1P0 at [redacted]w[redacted]d who presents to MAU with chief complaint of suprapubic cramping. This is a new problem, onset today. Patient states she cannot tell if she is  "gassy, constipated, or something with the baby". Pain is generalized across her lower abdomen. Pain score is 2/10 and does not radiate. She denies abdominal tenderness, dysuria, vaginal bleeding, fever or recent illness.  Patient endorses eating cream-based pasta at [employer] earlier today. She states her abdominal discomfort improved with passing flatus and having a bowel movement. She has discontinued using Zofran after learning it might be contributing to her mild constipation.   Patient receives prenatal care with [facility].  OB History     Gravida  1   Para      Term      Preterm      AB      Living         SAB      IAB      Ectopic      Multiple      Live Births              Past Medical History:  Diagnosis Date   BMI 35.0-35.9,adult    Medical history non-contributory     Past Surgical History:  Procedure Laterality Date   WISDOM TOOTH EXTRACTION      History reviewed. No pertinent family history.  Social History   Tobacco Use   Smoking status: Never   Smokeless tobacco: Never  Substance Use Topics   Alcohol use: Never   Drug use: Never    Allergies: No Known Allergies  Medications Prior to Admission  Medication Sig Dispense Refill Last Dose   Ascorbic Acid (VITAMIN C ADULT GUMMIES PO) Take 1 tablet by mouth daily.   Past Month   Doxylamine-Pyridoxine (DICLEGIS) 10-10 MG TBEC Take 2 tablets by mouth at bedtime as needed (nausea). 60 tablet 0 Past Week   ondansetron (ZOFRAN ODT) 4 MG disintegrating tablet Take 1 tablet (4 mg total) by mouth  every 6 (six) hours as needed for nausea. 20 tablet 0 Past Week   Prenatal Vit-Fe Fumarate-FA (PRENATAL MULTIVITAMIN) TABS tablet Take 1 tablet by mouth daily at 12 noon.   01/16/2021    Review of Systems  Gastrointestinal:  Positive for abdominal pain.  All other systems reviewed and are negative. Physical Exam   Blood pressure 133/64, pulse (!) 105, temperature 98 F (36.7 C), temperature source Oral, resp. rate 16, height 5\' 3"  (1.6 m), weight 101.7 kg, last menstrual period 09/21/2020, SpO2 100 %.  Physical Exam Vitals and nursing note reviewed. Exam conducted with a chaperone present.  Constitutional:      Appearance: She is well-developed. She is obese.  Cardiovascular:     Rate and Rhythm: Normal rate and regular rhythm.     Heart sounds: Normal heart sounds.  Pulmonary:     Effort: Pulmonary effort is normal.     Breath sounds: Normal breath sounds.  Abdominal:     Palpations: Abdomen is soft.     Tenderness: There is no abdominal tenderness.  Skin:    General: Skin is warm and dry.     Capillary Refill: Capillary refill takes  less than 2 seconds.  Neurological:     Mental Status: She is alert and oriented to person, place, and time.  Psychiatric:        Mood and Affect: Mood normal.        Behavior: Behavior normal.    MAU Course  Procedures  Orders Placed This Encounter  Procedures   Wet prep, genital   Urinalysis, Routine w reflex microscopic Urine, Clean Catch   Discharge patient   Patient Vitals for the past 24 hrs:  BP Temp Temp src Pulse Resp SpO2 Height Weight  01/16/21 2323 -- -- -- -- 20 -- -- --  01/16/21 2150 133/64 98 F (36.7 C) Oral (!) 105 16 100 % 5\' 3"  (1.6 m) 101.7 kg   Results for orders placed or performed during the hospital encounter of 01/16/21 (from the past 24 hour(s))  Wet prep, genital     Status: Abnormal   Collection Time: 01/16/21 10:33 PM   Specimen: PATH Cytology Cervicovaginal Ancillary Only  Result Value Ref Range    Yeast Wet Prep HPF POC NONE SEEN NONE SEEN   Trich, Wet Prep NONE SEEN NONE SEEN   Clue Cells Wet Prep HPF POC PRESENT (A) NONE SEEN   WBC, Wet Prep HPF POC FEW (A) NONE SEEN   Sperm NONE SEEN   Urinalysis, Routine w reflex microscopic Urine, Clean Catch     Status: Abnormal   Collection Time: 01/16/21 10:37 PM  Result Value Ref Range   Color, Urine YELLOW YELLOW   APPearance HAZY (A) CLEAR   Specific Gravity, Urine 1.027 1.005 - 1.030   pH 6.0 5.0 - 8.0   Glucose, UA NEGATIVE NEGATIVE mg/dL   Hgb urine dipstick NEGATIVE NEGATIVE   Bilirubin Urine NEGATIVE NEGATIVE   Ketones, ur NEGATIVE NEGATIVE mg/dL   Protein, ur NEGATIVE NEGATIVE mg/dL   Nitrite NEGATIVE NEGATIVE   Leukocytes,Ua NEGATIVE NEGATIVE   Meds ordered this encounter  Medications   docusate sodium (COLACE) 100 MG capsule    Sig: Take 1 capsule (100 mg total) by mouth 2 (two) times daily as needed.    Dispense:  30 capsule    Refill:  2    Order Specific Question:   Supervising Provider    Answer:   01/18/21 Myna Hidalgo   metroNIDAZOLE (FLAGYL) 500 MG tablet    Sig: Take 1 tablet (500 mg total) by mouth 2 (two) times daily.    Dispense:  14 tablet    Refill:  0    Order Specific Question:   Supervising Provider    Answer:   [[id number]] Myna Hidalgo   Assessment and Plan  --25 y.o. G1P0 at [redacted]w[redacted]d  --FHT 154 by Doppler --Constipation, diet revisions reviewed with patient --Lower abdominal cramping --Bacterial Vaginosis --Discharge home in stable condition  [redacted]w[redacted]d, CNM 01/17/2021, 1:13 AM

## 2021-01-19 LAB — GC/CHLAMYDIA PROBE AMP (~~LOC~~) NOT AT ARMC
Chlamydia: NEGATIVE
Comment: NEGATIVE
Comment: NORMAL
Neisseria Gonorrhea: NEGATIVE

## 2021-01-22 ENCOUNTER — Other Ambulatory Visit: Payer: Self-pay

## 2021-01-22 ENCOUNTER — Encounter (HOSPITAL_COMMUNITY): Payer: Self-pay | Admitting: Obstetrics & Gynecology

## 2021-01-22 ENCOUNTER — Inpatient Hospital Stay (HOSPITAL_COMMUNITY)
Admission: AD | Admit: 2021-01-22 | Discharge: 2021-01-23 | Disposition: A | Discharge status: Home / Self Care | Payer: BC Managed Care – PPO | Attending: Obstetrics & Gynecology | Admitting: Obstetrics & Gynecology

## 2021-01-22 DIAGNOSIS — R519 Headache, unspecified: Secondary | ICD-10-CM | POA: Insufficient documentation

## 2021-01-22 DIAGNOSIS — O219 Vomiting of pregnancy, unspecified: Secondary | ICD-10-CM | POA: Insufficient documentation

## 2021-01-22 DIAGNOSIS — Z3A17 17 weeks gestation of pregnancy: Secondary | ICD-10-CM | POA: Insufficient documentation

## 2021-01-22 DIAGNOSIS — O26892 Other specified pregnancy related conditions, second trimester: Secondary | ICD-10-CM | POA: Diagnosis not present

## 2021-01-22 LAB — URINALYSIS, ROUTINE W REFLEX MICROSCOPIC
Bilirubin Urine: NEGATIVE
Glucose, UA: NEGATIVE mg/dL
Hgb urine dipstick: NEGATIVE
Ketones, ur: NEGATIVE mg/dL
Leukocytes,Ua: NEGATIVE
Nitrite: NEGATIVE
Protein, ur: NEGATIVE mg/dL
Specific Gravity, Urine: 1.025 (ref 1.005–1.030)
pH: 6 (ref 5.0–8.0)

## 2021-01-22 MED ORDER — ACETAMINOPHEN 500 MG PO TABS: 500.0000 mg | ORAL_TABLET | Freq: Once | ORAL | Status: DC

## 2021-01-22 MED ORDER — ACETAMINOPHEN 500 MG PO TABS
500.0000 mg | ORAL_TABLET | Freq: Once | ORAL | Status: AC
  Administered 2021-01-22: 500 mg via ORAL
  Filled 2021-01-22: qty 1

## 2021-01-22 MED ORDER — LACTATED RINGERS IV SOLN: INTRAVENOUS | Status: DC

## 2021-01-22 MED ORDER — METOCLOPRAMIDE HCL 5 MG/ML IJ SOLN
10.0000 mg | Freq: Once | INTRAMUSCULAR | Status: AC
  Administered 2021-01-22: 10 mg via INTRAVENOUS
  Filled 2021-01-22: qty 2

## 2021-01-22 MED ORDER — CYCLOBENZAPRINE HCL 5 MG PO TABS
10.0000 mg | ORAL_TABLET | Freq: Once | ORAL | Status: AC
  Administered 2021-01-22: 10 mg via ORAL
  Filled 2021-01-22: qty 2

## 2021-01-22 MED ORDER — DIPHENHYDRAMINE HCL 50 MG/ML IJ SOLN
25.0000 mg | Freq: Once | INTRAMUSCULAR | Status: AC
  Administered 2021-01-22: 25 mg via INTRAVENOUS
  Filled 2021-01-22: qty 1

## 2021-01-22 NOTE — MAU Note (Addendum)
Pt reports she started having a headache last night. Woke up and had some blurred vision and neck hurts. Took tylenol  around 11am did not help.  Pt stated she has na/v(morning sickness only vomited once today.

## 2021-01-22 NOTE — MAU Provider Note (Addendum)
Chief Complaint: Headache   Event Date/Time   First Provider Initiated Contact with Patient 01/22/21 2135      SUBJECTIVE HPI: Tracy Keller is a 25 y.o. G1P0 at [redacted]w[redacted]d who presents to maternity admissions reporting onset of severe headache this morning, 01/22/21, when she woke up. She reports n/v in the pregnancy that is unchanged with the headache. She denies any photophobia or dizziness.   Location: frontal and neck/occipital Quality: dull, pressure Severity: 10/10 on pain scale Duration: 1 day Timing: constant Modifying factors: Worse when moves her neck, not improved by Tylenol Associated signs and symptoms: none  Headache  This is a new problem. The current episode started today. The problem has been unchanged. The pain is located in the Occipital and frontal region. The quality of the pain is described as aching and dull. Associated symptoms include blurred vision, neck pain and photophobia. Pertinent negatives include no back pain, sinus pressure, sore throat or weakness. The symptoms are aggravated by bright light. She has tried acetaminophen (only took one tylenol) for the symptoms. The treatment provided no relief.   Past Medical History:  Diagnosis Date   BMI 35.0-35.9,adult    Medical history non-contributory    Past Surgical History:  Procedure Laterality Date   WISDOM TOOTH EXTRACTION     Social History   Socioeconomic History   Marital status: Single    Spouse name: Not on file   Number of children: Not on file   Years of education: Not on file   Highest education level: Not on file  Occupational History   Not on file  Tobacco Use   Smoking status: Never   Smokeless tobacco: Never  Substance and Sexual Activity   Alcohol use: Never   Drug use: Never   Sexual activity: Yes  Other Topics Concern   Not on file  Social History Narrative   Not on file   Social Determinants of Health   Financial Resource Strain: Not on file  Food Insecurity: Not on file   Transportation Needs: Not on file  Physical Activity: Not on file  Stress: Not on file  Social Connections: Not on file  Intimate Partner Violence: Not on file   No current facility-administered medications on file prior to encounter.   Current Outpatient Medications on File Prior to Encounter  Medication Sig Dispense Refill   ondansetron (ZOFRAN ODT) 4 MG disintegrating tablet Take 1 tablet (4 mg total) by mouth every 6 (six) hours as needed for nausea. 20 tablet 0   Prenatal Vit-Fe Fumarate-FA (PRENATAL MULTIVITAMIN) TABS tablet Take 1 tablet by mouth daily at 12 noon.     Ascorbic Acid (VITAMIN C ADULT GUMMIES PO) Take 1 tablet by mouth daily.     docusate sodium (COLACE) 100 MG capsule Take 1 capsule (100 mg total) by mouth 2 (two) times daily as needed. 30 capsule 2   Doxylamine-Pyridoxine (DICLEGIS) 10-10 MG TBEC Take 2 tablets by mouth at bedtime as needed (nausea). 60 tablet 0   metroNIDAZOLE (FLAGYL) 500 MG tablet Take 1 tablet (500 mg total) by mouth 2 (two) times daily. 14 tablet 0   No Known Allergies  ROS:  Review of Systems  HENT:  Negative for sinus pressure and sore throat.   Eyes:  Positive for blurred vision and photophobia.  Musculoskeletal:  Positive for neck pain. Negative for back pain.  Neurological:  Positive for headaches. Negative for weakness.    I have reviewed patient's Past Medical Hx, Surgical Hx, Family Hx, Social  Hx, medications and allergies.   Physical Exam  Patient Vitals for the past 24 hrs:  BP Temp Temp src Pulse Resp SpO2 Height Weight  01/22/21 2119 125/66 98 F (36.7 C) Oral (!) 107 18 100 % -- --  01/22/21 2100 128/67 98.3 F (36.8 C) -- (!) 104 18 -- 5\' 3"  (1.6 m) 101.2 kg   Constitutional: Well-developed, well-nourished female in no acute distress.  Cardiovascular: normal rate Respiratory: normal effort GI: Abd soft, non-tender. Pos BS x 4 MS: Extremities nontender, no edema, normal ROM Neurological - alert, oriented, normal  speech, no focal findings or movement disorder noted, screening mental status exam normal, neck supple without rigidity, cranial nerves II through XII intact, DTR's normal and symmetric, motor and sensory grossly normal bilaterally, normal muscle tone, no tremors, strength 5/5  GU: Neg CVAT.  FHT 152 by doppler  LAB RESULTS Results for orders placed or performed during the hospital encounter of 01/22/21 (from the past 24 hour(s))  Urinalysis, Routine w reflex microscopic Urine, Clean Catch     Status: Abnormal   Collection Time: 01/22/21  8:50 PM  Result Value Ref Range   Color, Urine YELLOW YELLOW   APPearance CLOUDY (A) CLEAR   Specific Gravity, Urine 1.025 1.005 - 1.030   pH 6.0 5.0 - 8.0   Glucose, UA NEGATIVE NEGATIVE mg/dL   Hgb urine dipstick NEGATIVE NEGATIVE   Bilirubin Urine NEGATIVE NEGATIVE   Ketones, ur NEGATIVE NEGATIVE mg/dL   Protein, ur NEGATIVE NEGATIVE mg/dL   Nitrite NEGATIVE NEGATIVE   Leukocytes,Ua NEGATIVE NEGATIVE   RBC / HPF 0-5 0 - 5 RBC/hpf   WBC, UA 6-10 0 - 5 WBC/hpf   Bacteria, UA RARE (A) NONE SEEN   Squamous Epithelial / LPF 21-50 0 - 5   Mucus PRESENT    Ca Oxalate Crys, UA PRESENT     IMAGING No results found.  MAU Management/MDM: Orders Placed This Encounter  Procedures   Urinalysis, Routine w reflex microscopic Urine, Clean Catch      Normal neuro exam.  No n/v or photophobia so less likely migraine.  Neck pain and recent stress reported by pt makes tension headache likely. Flexeril 10 mg PO given in MAU.    Report to 01/24/21, CNM    Wynelle Bourgeois Certified Nurse-Midwife 01/22/2021  9:35 PM  Flexeril did not provide relief So we ordered IV fluids, Reglan and Benadryl, and Tylenol 500mg  (took only one at home) Will consider adding caffeine if necessary  She did get relief from the initial treatment DId not need to give caffeine  Headache went from 8 to 4 Discussed if headaches recur, may need referral to  specialist  A:  Single IUP at [redacted]w[redacted]d       Headache, possibly migraine  P:   Discharge home       Tylenol prn headache       Return if unresponsive to tylenol      Followup at office for prenatal visits Encouraged to return if she develops worsening of symptoms, increase in pain, fever, or other concerning symptoms.

## 2021-01-26 ENCOUNTER — Inpatient Hospital Stay (HOSPITAL_COMMUNITY)
Admission: AD | Admit: 2021-01-26 | Discharge: 2021-01-26 | Disposition: A | Discharge status: Home / Self Care | Payer: BC Managed Care – PPO | Attending: Obstetrics and Gynecology | Admitting: Obstetrics and Gynecology

## 2021-01-26 ENCOUNTER — Other Ambulatory Visit: Payer: Self-pay

## 2021-01-26 DIAGNOSIS — O26892 Other specified pregnancy related conditions, second trimester: Secondary | ICD-10-CM | POA: Diagnosis present

## 2021-01-26 DIAGNOSIS — O99891 Other specified diseases and conditions complicating pregnancy: Secondary | ICD-10-CM | POA: Diagnosis not present

## 2021-01-26 DIAGNOSIS — Z3A18 18 weeks gestation of pregnancy: Secondary | ICD-10-CM | POA: Diagnosis not present

## 2021-01-26 DIAGNOSIS — R202 Paresthesia of skin: Secondary | ICD-10-CM | POA: Insufficient documentation

## 2021-01-26 DIAGNOSIS — R200 Anesthesia of skin: Secondary | ICD-10-CM | POA: Diagnosis not present

## 2021-01-26 DIAGNOSIS — H538 Other visual disturbances: Secondary | ICD-10-CM | POA: Diagnosis not present

## 2021-01-26 NOTE — MAU Provider Note (Signed)
  History     CSN: [id number]  Arrival date and time: 01/26/21 0118   Event Date/Time   First Provider Initiated Contact with Patient 01/26/21 0141      Chief Complaint  Patient presents with   Numbness   HPI Tracy Keller is a 25 y.o. G1P0 at [redacted]w[redacted]d who presents with right sided body numbness. Woke up with these symptoms at 1 am this morning. Reports numbness & tingling of her right arm, right leg, & right side of her face. States it "feels like novocain". Reports a mild headache that she rates 1/10. Denies dizziness, chest pain, SOB. Denies abdominal pain or vaginal bleeding.   Past Medical History:  Diagnosis Date   BMI 35.0-35.9,adult    Medical history non-contributory     Past Surgical History:  Procedure Laterality Date   WISDOM TOOTH EXTRACTION       Allergies: No Known Allergies  Review of Systems  Constitutional: Negative.   Respiratory:  Negative for shortness of breath.   Cardiovascular:  Negative for chest pain.  Gastrointestinal: Negative.   Genitourinary: Negative.   Neurological:  Positive for numbness and headaches. Negative for dizziness, syncope, facial asymmetry and weakness.  Physical Exam   Blood pressure 122/64, pulse 91, temperature 98.5 F (36.9 C), resp. rate 17, height 5\' 3"  (1.6 m), weight 102.2 kg, last menstrual period 09/21/2020, SpO2 100 %.  Physical Exam Constitutional:      General: She is not in acute distress.    Appearance: Normal appearance.  Pulmonary:     Effort: Pulmonary effort is normal. No respiratory distress.  Skin:    General: Skin is warm and dry.  Neurological:     Mental Status: She is alert and oriented to person, place, and time.     Cranial Nerves: No facial asymmetry.     Motor: No weakness or seizure activity.     Deep Tendon Reflexes:     Reflex Scores:      Patellar reflexes are 2+ on the right side and 2+ on the left side. Psychiatric:        Mood and Affect: Mood normal.        Behavior: Behavior  normal.    MAU Course  FHT present via doppler. Patient presents with complaint of numbness & tingling of right side of her body. No OB complaints. Will transfer to ED for further evaluation  Assessment and Plan   1. Numbness and tingling of right upper and lower extremity   2. Numbness and tingling of right side of face   3. [redacted] weeks gestation of pregnancy    -Spoke with ED provider. Dr. 09/23/2020 accepts transfer of the patient  Eudelia Bunch 01/26/2021, 1:54 AM

## 2021-01-26 NOTE — MAU Note (Signed)
..  Tracy Keller is a 25 y.o. at [redacted]w[redacted]d here in MAU reporting: Complete right sided numbness and tingling This began around 1am and began with her hand feeling weak. She has had this happen to her last year, EMS came and said everything looked fine, then she went to a specialist said everything looked normal.   Denies vaginal bleeding or leaking of fluid.  Pain score: 0/10 Vitals:   01/26/21 0135  BP: 122/64  Pulse: 91  Resp: 17  Temp: 98.5 F (36.9 C)  SpO2: 100%     FHT:152 by doppler

## 2021-01-27 ENCOUNTER — Encounter (HOSPITAL_COMMUNITY): Payer: Self-pay | Admitting: Obstetrics and Gynecology

## 2021-01-27 ENCOUNTER — Inpatient Hospital Stay (HOSPITAL_COMMUNITY)
Admission: AD | Admit: 2021-01-27 | Discharge: 2021-01-27 | Disposition: A | Discharge status: Home / Self Care | Payer: BC Managed Care – PPO | Attending: Obstetrics and Gynecology | Admitting: Obstetrics and Gynecology

## 2021-01-27 ENCOUNTER — Other Ambulatory Visit: Payer: Self-pay

## 2021-01-27 DIAGNOSIS — O99891 Other specified diseases and conditions complicating pregnancy: Secondary | ICD-10-CM | POA: Diagnosis not present

## 2021-01-27 DIAGNOSIS — O26892 Other specified pregnancy related conditions, second trimester: Secondary | ICD-10-CM | POA: Insufficient documentation

## 2021-01-27 DIAGNOSIS — H538 Other visual disturbances: Secondary | ICD-10-CM | POA: Insufficient documentation

## 2021-01-27 DIAGNOSIS — Z3A18 18 weeks gestation of pregnancy: Secondary | ICD-10-CM | POA: Diagnosis not present

## 2021-01-27 DIAGNOSIS — R519 Headache, unspecified: Secondary | ICD-10-CM | POA: Insufficient documentation

## 2021-01-27 NOTE — MAU Note (Signed)
Pt reports her vision has been blurry since yesterday. Pt reports she has had a headache off/on for 4 days.

## 2021-01-27 NOTE — MAU Provider Note (Signed)
History     CSN: [id number]  Arrival date and time: 01/27/21 2217   Event Date/Time   First Provider Initiated Contact with Patient 01/27/21 2330      Chief Complaint  Patient presents with   Blurred Vision   Tracy Keller is a 25 y.o. G1P0 at [redacted]w[redacted]d who receives care at [facility].  She presents today for Blurred Vision and HA.  She states her blurry vision started yesterday around 2 or 3 pm. She states the night before she was having numbness on her face, but did not receive an evaluation despite provider recommendation.  She states her vision is okay when trying to view something close, but it worsens when trying to focus on objects farther than arms length. She states endorses spotty vision.  She reports she last seen an eye doctor last summer and did not require a prescription change. She goes on to state "I have a strong prescription already" and notes that her symptoms occur while wearing her glasses.  She states her HA started last week and has been ongoing despite treatment, last week, with IV hydration and medication. She reports it is intermittent and "today I am fine."  She states "right now I feel like I have soreness in my head."  She denies a history of HA and migraines.  She states when the HA occurs it is frontal at the center of her head or behind her eye.    OB History     Gravida  1   Para      Term      Preterm      AB      Living         SAB      IAB      Ectopic      Multiple      Live Births              Past Medical History:  Diagnosis Date   BMI 35.0-35.9,adult    Medical history non-contributory     Past Surgical History:  Procedure Laterality Date   WISDOM TOOTH EXTRACTION      History reviewed. No pertinent family history.  Social History   Tobacco Use   Smoking status: Never   Smokeless tobacco: Never  Substance Use Topics   Alcohol use: Never   Drug use: Never    Allergies: No Known Allergies  Medications Prior to  Admission  Medication Sig Dispense Refill Last Dose   ondansetron (ZOFRAN ODT) 4 MG disintegrating tablet Take 1 tablet (4 mg total) by mouth every 6 (six) hours as needed for nausea. 20 tablet 0 01/27/2021   Prenatal Vit-Fe Fumarate-FA (PRENATAL MULTIVITAMIN) TABS tablet Take 1 tablet by mouth daily at 12 noon.   01/27/2021   Ascorbic Acid (VITAMIN C ADULT GUMMIES PO) Take 1 tablet by mouth daily.      docusate sodium (COLACE) 100 MG capsule Take 1 capsule (100 mg total) by mouth 2 (two) times daily as needed. 30 capsule 2    Doxylamine-Pyridoxine (DICLEGIS) 10-10 MG TBEC Take 2 tablets by mouth at bedtime as needed (nausea). 60 tablet 0    metroNIDAZOLE (FLAGYL) 500 MG tablet Take 1 tablet (500 mg total) by mouth 2 (two) times daily. 14 tablet 0     Review of Systems  Constitutional:  Negative for chills and fever.  Eyes:  Positive for visual disturbance (Blurry Vision).  Respiratory:  Negative for cough and shortness of breath.   Gastrointestinal:  Positive for constipation (Takes Zofran), nausea and vomiting. Negative for abdominal pain and diarrhea.  Genitourinary:  Negative for difficulty urinating, dysuria, vaginal bleeding and vaginal discharge.  Neurological:  Positive for headaches. Negative for dizziness and light-headedness (None currently).  Physical Exam   Blood pressure 138/68, pulse 87, temperature 98.4 F (36.9 C), resp. rate 15, last menstrual period 09/21/2020, SpO2 98 %.  Physical Exam Constitutional:      Appearance: Normal appearance.  HENT:     Head: Normocephalic and atraumatic.  Eyes:     Conjunctiva/sclera: Conjunctivae normal.  Cardiovascular:     Rate and Rhythm: Normal rate.  Pulmonary:     Effort: Pulmonary effort is normal. No respiratory distress.  Musculoskeletal:        General: Normal range of motion.  Skin:    General: Skin is warm and dry.  Neurological:     General: No focal deficit present.     Mental Status: She is alert and oriented to  person, place, and time.     Cranial Nerves: Cranial nerves are intact.     Sensory: Sensation is intact.     Motor: No weakness.     Coordination: Coordination is intact.     Gait: Gait is intact.     Comments: Patient reports she can see clearly when covering one eye. Blurry vision returns when both eyes in use.   Patient able to ambulate within room without difficulty.   Psychiatric:        Mood and Affect: Mood normal.        Behavior: Behavior normal.        Thought Content: Thought content normal.   FHT: Doppler 155 MAU Course  Procedures No results found for this or any previous visit (from the past 24 hour(s)).  MDM Exam Referral Assessment and Plan  25 year old, G1P0 SIUP at 18.3 weeks Blurry Vision  -POC Reviewed. -Neurological Exam performed and without deficit. -Discussed need for appt with optometrist for eye evaluation.  *Encouraged to follow up in next 1-2 days if possible -Informed that provider will place neurology consult for new onset headaches. -Discussed follow up with primary ob. -Patient without questions or concerns.  -Encouraged to call or return to MAU if symptoms worsen or with the onset of new symptoms. -Discharged to home in stable condition.   Cherre Robins 01/27/2021, 11:30 PM

## 2021-01-30 ENCOUNTER — Inpatient Hospital Stay (HOSPITAL_COMMUNITY)
Admission: EM | Admit: 2021-01-30 | Discharge: 2021-02-01 | DRG: 833 | Disposition: A | Discharge status: Home / Self Care | Payer: BC Managed Care – PPO | Attending: Internal Medicine | Admitting: Internal Medicine

## 2021-01-30 ENCOUNTER — Telehealth: Payer: Self-pay | Admitting: Diagnostic Neuroimaging

## 2021-01-30 ENCOUNTER — Encounter (HOSPITAL_COMMUNITY): Payer: Self-pay

## 2021-01-30 ENCOUNTER — Other Ambulatory Visit: Payer: Self-pay

## 2021-01-30 ENCOUNTER — Ambulatory Visit: Payer: BC Managed Care – PPO | Admitting: Diagnostic Neuroimaging

## 2021-01-30 ENCOUNTER — Inpatient Hospital Stay (HOSPITAL_COMMUNITY): Payer: BC Managed Care – PPO

## 2021-01-30 ENCOUNTER — Encounter: Payer: Self-pay | Admitting: Diagnostic Neuroimaging

## 2021-01-30 VITALS — BP 121/70 | HR 101 | Ht 63.0 in | Wt 222.6 lb

## 2021-01-30 DIAGNOSIS — H4710 Unspecified papilledema: Secondary | ICD-10-CM | POA: Diagnosis not present

## 2021-01-30 DIAGNOSIS — Z20822 Contact with and (suspected) exposure to covid-19: Secondary | ICD-10-CM | POA: Diagnosis present

## 2021-01-30 DIAGNOSIS — O99352 Diseases of the nervous system complicating pregnancy, second trimester: Principal | ICD-10-CM | POA: Diagnosis present

## 2021-01-30 DIAGNOSIS — R200 Anesthesia of skin: Secondary | ICD-10-CM | POA: Diagnosis not present

## 2021-01-30 DIAGNOSIS — H532 Diplopia: Secondary | ICD-10-CM | POA: Diagnosis present

## 2021-01-30 DIAGNOSIS — R519 Headache, unspecified: Secondary | ICD-10-CM | POA: Diagnosis present

## 2021-01-30 DIAGNOSIS — H547 Unspecified visual loss: Secondary | ICD-10-CM | POA: Diagnosis present

## 2021-01-30 DIAGNOSIS — G932 Benign intracranial hypertension: Secondary | ICD-10-CM | POA: Diagnosis present

## 2021-01-30 DIAGNOSIS — Z3A18 18 weeks gestation of pregnancy: Secondary | ICD-10-CM

## 2021-01-30 DIAGNOSIS — Z79899 Other long term (current) drug therapy: Secondary | ICD-10-CM

## 2021-01-30 LAB — COMPREHENSIVE METABOLIC PANEL
ALT: 24 U/L (ref 0–44)
AST: 20 U/L (ref 15–41)
Albumin: 3.4 g/dL — ABNORMAL LOW (ref 3.5–5.0)
Alkaline Phosphatase: 70 U/L (ref 38–126)
Anion gap: 9 (ref 5–15)
BUN: 5 mg/dL — ABNORMAL LOW (ref 6–20)
CO2: 23 mmol/L (ref 22–32)
Calcium: 9.6 mg/dL (ref 8.9–10.3)
Chloride: 104 mmol/L (ref 98–111)
Creatinine, Ser: 0.63 mg/dL (ref 0.44–1.00)
GFR, Estimated: >60 mL/min (ref >60)
Glucose, Bld: 103 mg/dL — ABNORMAL HIGH (ref 70–99)
Potassium: 3.7 mmol/L (ref 3.5–5.1)
Sodium: 136 mmol/L (ref 135–145)
Total Bilirubin: 0.2 mg/dL — ABNORMAL LOW (ref 0.3–1.2)
Total Protein: 7.1 g/dL (ref 6.5–8.1)

## 2021-01-30 LAB — CBC WITH DIFFERENTIAL/PLATELET
Abs Immature Granulocytes: 0.07 10*3/uL (ref 0.00–0.07)
Basophils Absolute: 0 10*3/uL (ref 0.0–0.1)
Basophils Relative: 0 %
Eosinophils Absolute: 0 10*3/uL (ref 0.0–0.5)
Eosinophils Relative: 0 %
HCT: 39.6 % (ref 36.0–46.0)
Hemoglobin: 12.2 g/dL (ref 12.0–15.0)
Immature Granulocytes: 1 %
Lymphocytes Relative: 19 %
Lymphs Abs: 2.3 10*3/uL (ref 0.7–4.0)
MCH: 25.5 pg — ABNORMAL LOW (ref 26.0–34.0)
MCHC: 30.8 g/dL (ref 30.0–36.0)
MCV: 82.8 fL (ref 80.0–100.0)
Monocytes Absolute: 0.6 10*3/uL (ref 0.1–1.0)
Monocytes Relative: 5 %
Neutro Abs: 9.3 10*3/uL — ABNORMAL HIGH (ref 1.7–7.7)
Neutrophils Relative %: 75 %
Platelets: 284 10*3/uL (ref 150–400)
RBC: 4.78 MIL/uL (ref 3.87–5.11)
RDW: 15.3 % (ref 11.5–15.5)
WBC: 12.3 10*3/uL — ABNORMAL HIGH (ref 4.0–10.5)
nRBC: 0 % (ref 0.0–0.2)

## 2021-01-30 LAB — PROTIME-INR
INR: 1.1 (ref 0.8–1.2)
Prothrombin Time: 13.9 seconds (ref 11.4–15.2)

## 2021-01-30 IMAGING — MR MR HEAD W/O CM
12 of 13 series · 44 of 48 positions shown · non-contrast
Comparison: No pertinent prior exam.

CLINICAL DATA: Headache and papilledema

EXAM:
MRI HEAD WITHOUT CONTRAST
MRV HEAD WITHOUT CONTRAST
TECHNIQUE: Multiplanar, multi-echo pulse sequences of the brain and surrounding
structures were acquired without intravenous contrast. Angiographic
images of the intracranial venous structures were acquired using MRV
technique without intravenous contrast.

[Series 5: DWI · axial · 3.0mm · 0.88mm/px · z∈[-153,-7]mm · 7 of 100 slices shown (1 of 4)]
[im 1/100]
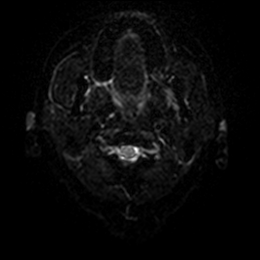
[im 17/100]
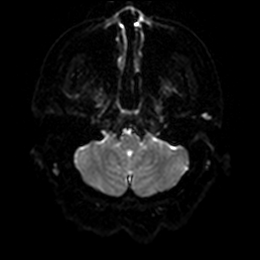
[im 34/100]
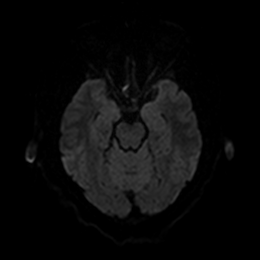
[im 50/100]
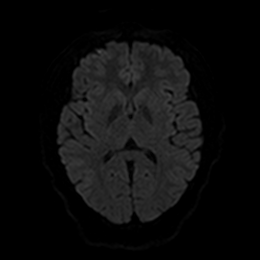
[im 67/100]
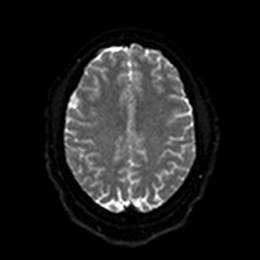
[im 83/100]
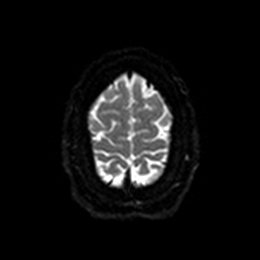
[im 100/100]
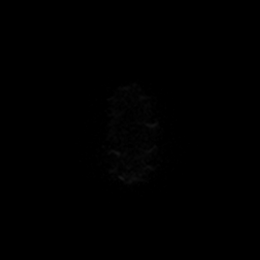

[Series 6: DWI · axial · 3.0mm · 0.88mm/px · z∈[-153,-7]mm · 4 of 50 slices shown (2 of 4)]
[im 1/50]
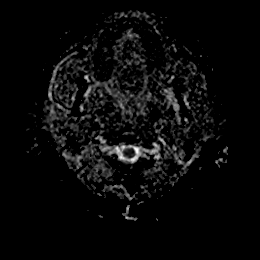
[im 17/50]
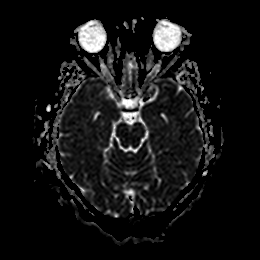
[im 33/50]
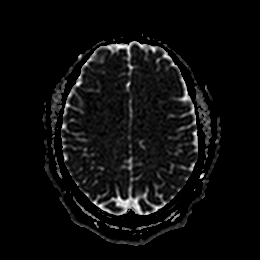
[im 50/50]
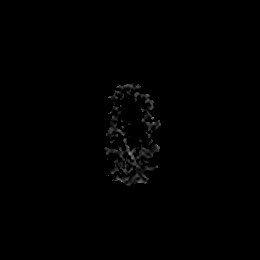

[Series 7: DWI · coronal · 4.0mm · 0.88mm/px · 6 of 70 slices shown (3 of 4)]
[im 1/70]
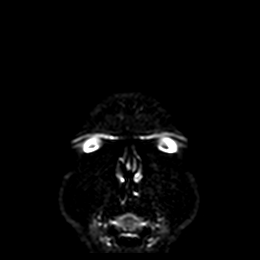
[im 14/70]
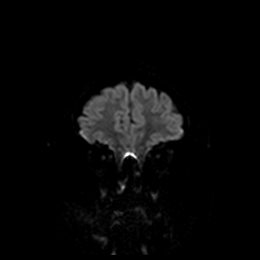
[im 28/70]
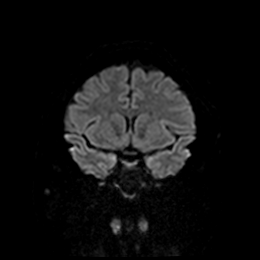
[im 42/70]
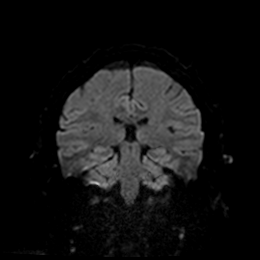
[im 56/70]
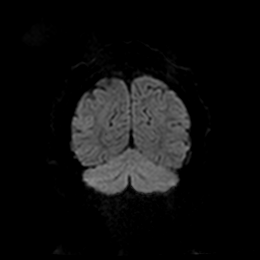
[im 70/70]
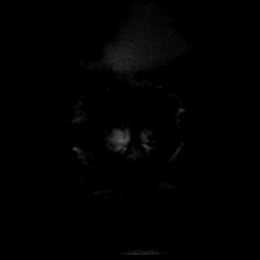

[Series 8: DWI · coronal · 4.0mm · 0.88mm/px · 3 of 36 slices shown (4 of 4)]
[im 1/36]
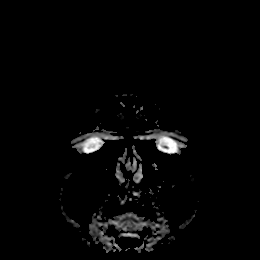
[im 18/36]
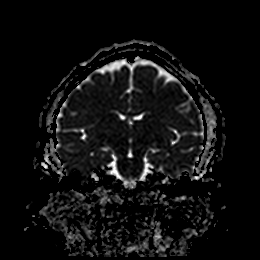
[im 36/36]
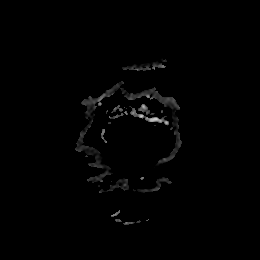

[Series 18: T2 · axial · 5.0mm · 0.72mm/px · z∈[-152,-8]mm · 2 of 25 slices shown (1 of 2)]
[im 1/25]
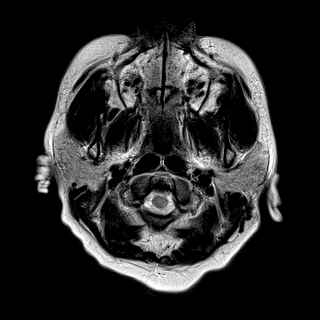
[im 25/25]
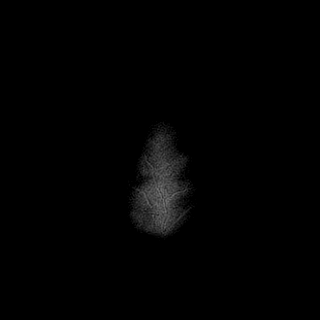

[Series 20: FLAIR · axial · 5.0mm · 0.90mm/px · z∈[-152,-8]mm · 2 of 25 slices shown]
[im 1/25]
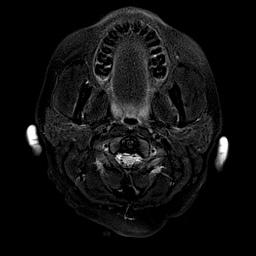
[im 25/25]
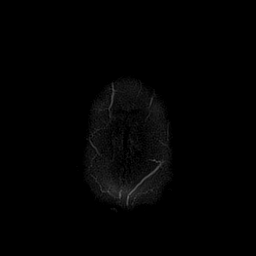

[Series 21: mag_images · axial · 3.0mm · 0.90mm/px · z∈[-156,-3]mm · 4 of 52 slices shown]
[im 1/52]
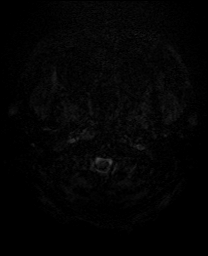
[im 18/52]
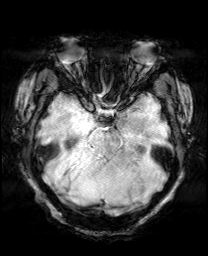
[im 35/52]
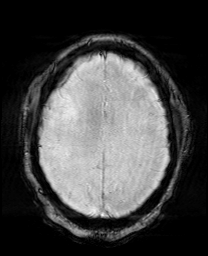
[im 52/52]
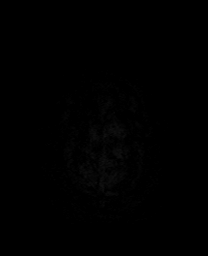

[Series 22: pha_images · axial · 3.0mm · 0.90mm/px · z∈[-156,-3]mm · 4 of 52 slices shown]
[im 1/52]
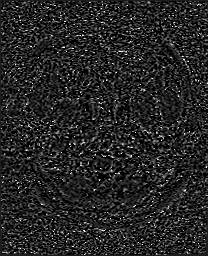
[im 18/52]
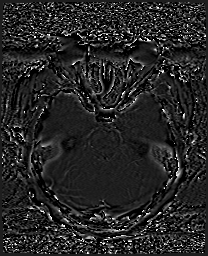
[im 35/52]
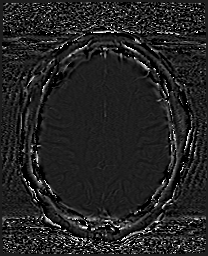
[im 52/52]
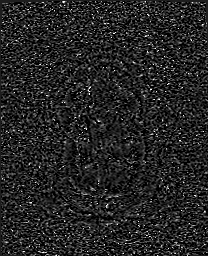

[Series 23: swi_images · axial · 3.0mm · 0.90mm/px · z∈[-156,-3]mm · 4 of 52 slices shown]
[im 1/52]
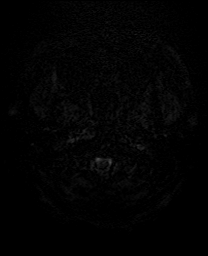
[im 18/52]
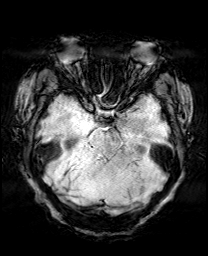
[im 35/52]
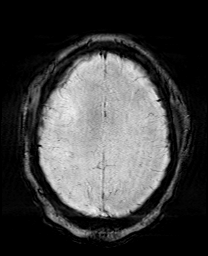
[im 52/52]
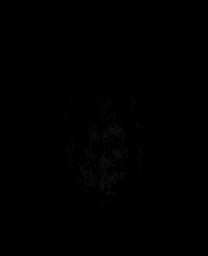

[Series 24: mip_images(sw) · axial · 24.0mm · 0.90mm/px · z∈[-145,-14]mm · 4 of 45 slices shown]
[im 1/45]
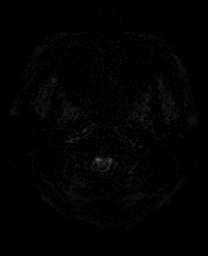
[im 15/45]
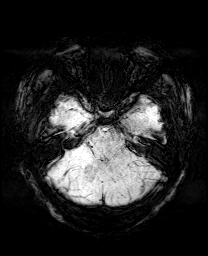
[im 30/45]
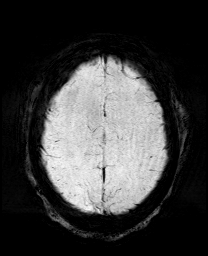
[im 45/45]
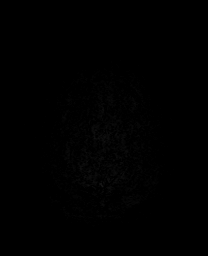

[Series 26: T2 · coronal · 5.0mm · 0.72mm/px · 2 of 28 slices shown (2 of 2)]
[im 1/28]
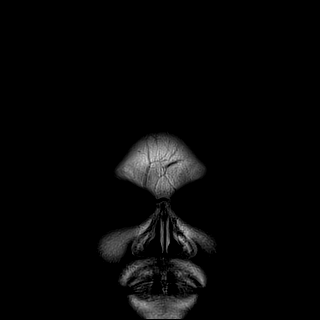
[im 28/28]
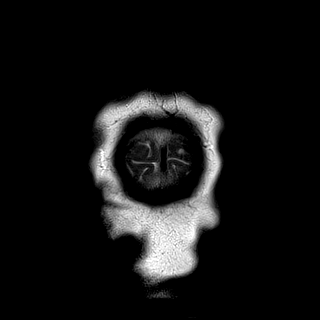

[Series 27: T1 · sagittal · 5.0mm · 0.90mm/px · 2 of 23 slices shown]
[im 1/23]
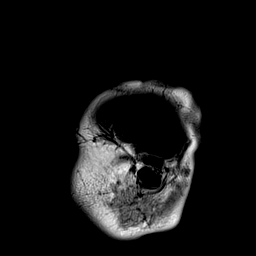
[im 23/23]
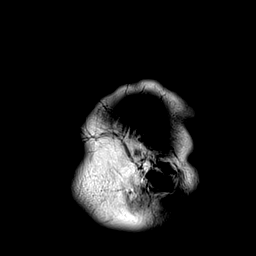

[44 of 48 positions shown; findings below may reference images not displayed]

FINDINGS: MRI HEAD WITHOUT CONTRAST

Brain: No acute infarct, mass effect or extra-axial collection. No
acute or chronic hemorrhage. Normal white matter signal, parenchymal
volume and CSF spaces. The midline structures are normal.

Vascular: Major flow voids are preserved.

Skull and upper cervical spine: Normal calvarium and skull base.
Visualized upper cervical spine and soft tissues are normal.

Sinuses/Orbits:No paranasal sinus fluid levels or advanced mucosal
thickening. No mastoid or middle ear effusion. Normal orbits.

MR VENOGRAM WITHOUT CONTRAST

Superior sagittal sinus: Normal.

Straight sinus: Normal.

Inferior sagittal sinus, vein of [place] and internal cerebral veins:
Normal.

Transverse sinuses: Focal narrowing bilaterally at the junction the
transverse and sigmoid sinuses

Sigmoid sinuses: Normal.

Visualized jugular veins: Normal.
IMPRESSION: 1. Normal MRI of the brain.
2. Focal narrowing at the junction of the transverse and sigmoid
sinuses bilaterally, possibly arachnoid granulations. MRV with
contrast or CT venogram is recommended.

## 2021-01-30 IMAGING — MR MR MRV HEAD W/O CM
3 series · 41 of 48 positions shown · non-contrast
Comparison: No pertinent prior exam.

CLINICAL DATA: Headache and papilledema

EXAM:
MRI HEAD WITHOUT CONTRAST
MRV HEAD WITHOUT CONTRAST
TECHNIQUE: Multiplanar, multi-echo pulse sequences of the brain and surrounding
structures were acquired without intravenous contrast. Angiographic
images of the intracranial venous structures were acquired using MRV
technique without intravenous contrast.

[Series 9: tof_fl2d_paracor · coronal · 2.0mm · 0.98mm/px · 12 of 148 slices shown]
[im 1/148]
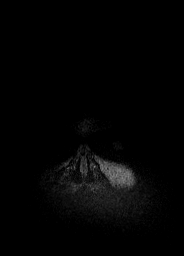
[im 14/148]
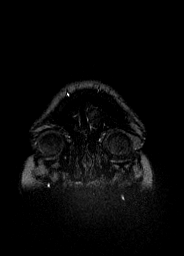
[im 27/148]
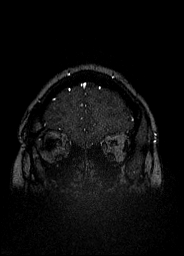
[im 41/148]
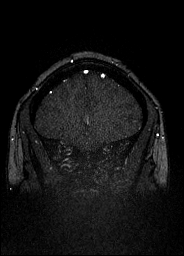
[im 54/148]
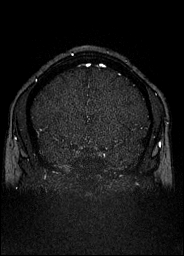
[im 67/148]
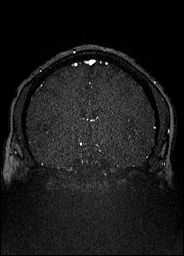
[im 81/148]
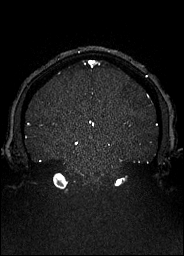
[im 94/148]
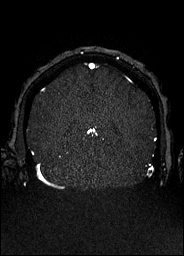
[im 107/148]
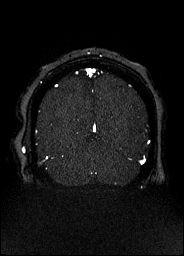
[im 121/148]
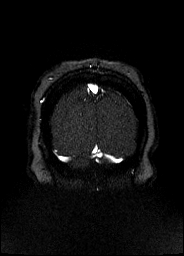
[im 134/148]
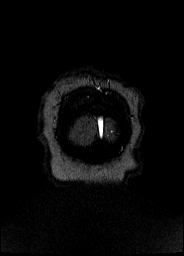
[im 148/148]
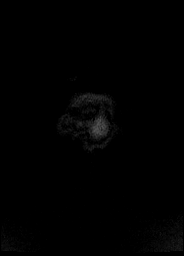

[Series 13: venous inhance coronal · coronal · portal-venous · 0.9mm · 0.57mm/px · 18 of 240 slices shown]
[im 1/240]
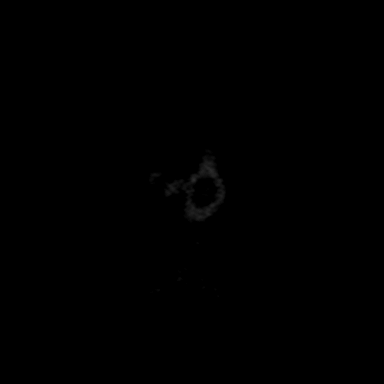
[im 15/240]
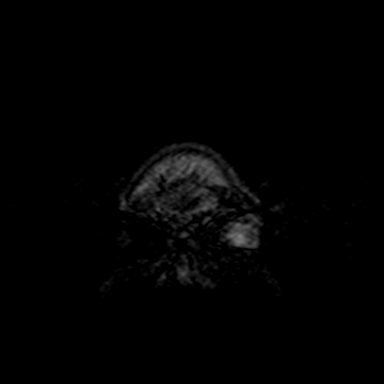
[im 29/240]
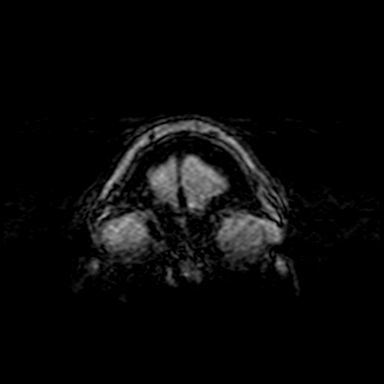
[im 43/240]
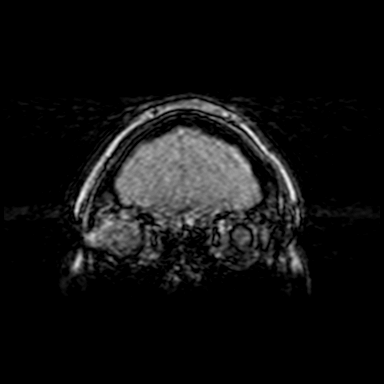
[im 57/240]
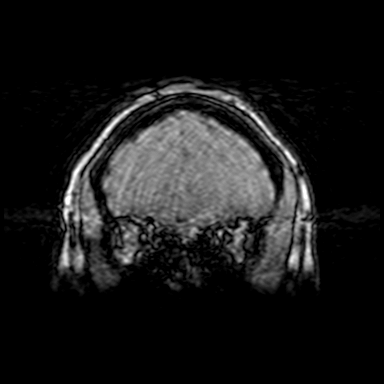
[im 71/240]
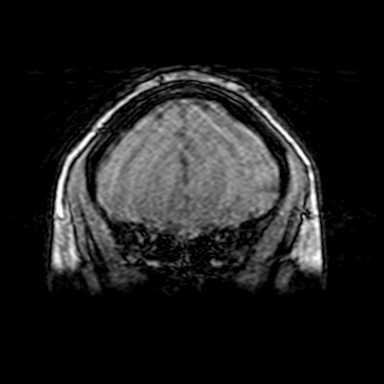
[im 85/240]
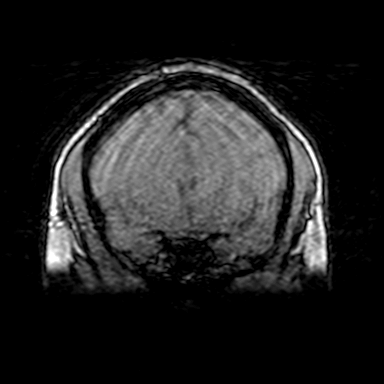
[im 99/240]
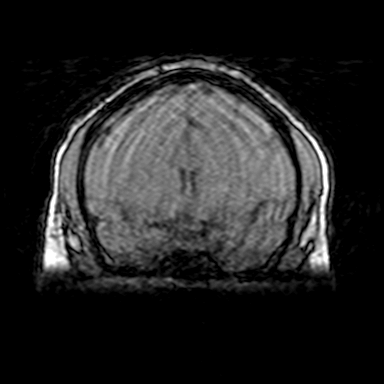
[im 113/240]
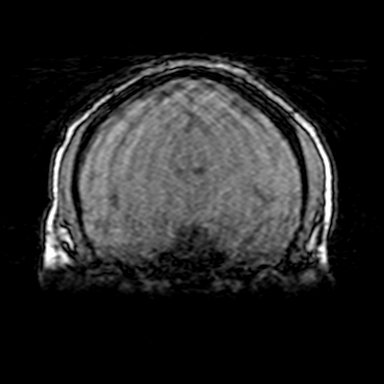
[im 127/240]
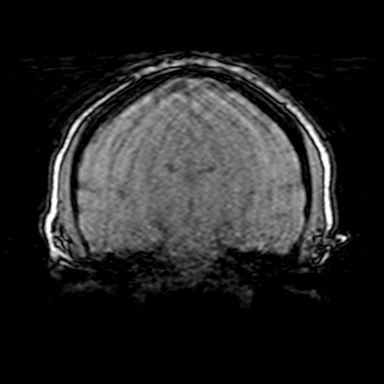
[im 141/240]
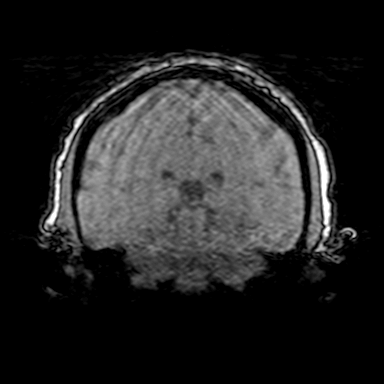
[im 155/240]
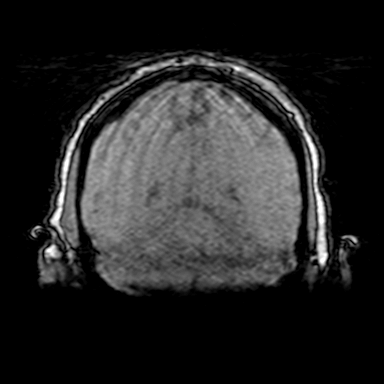
[im 169/240]
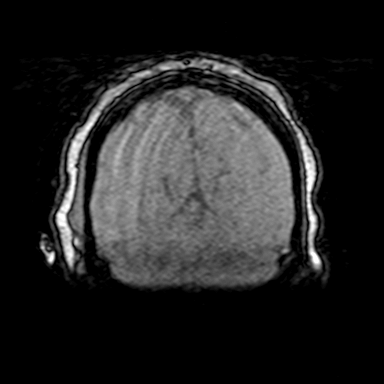
[im 183/240]
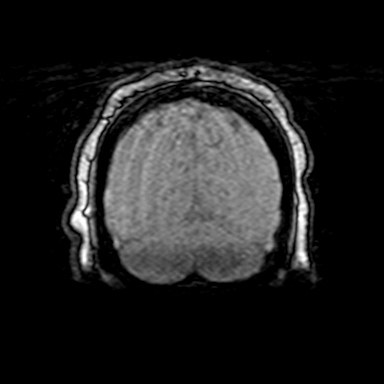
[im 197/240]
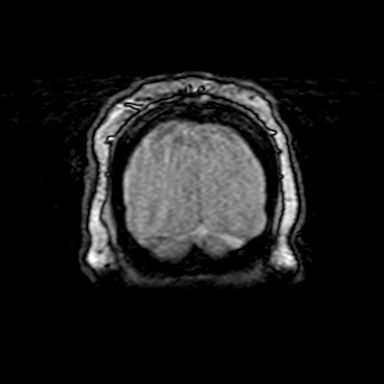
[im 211/240]
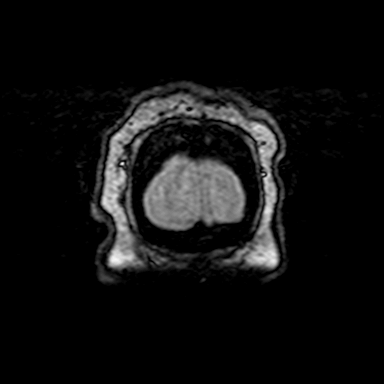
[im 225/240]
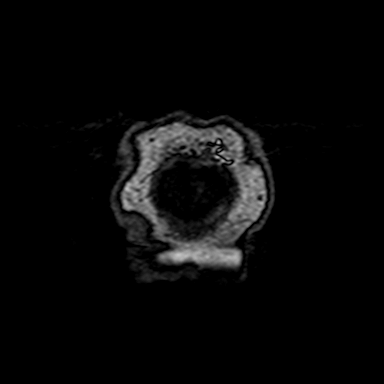
[im 240/240]
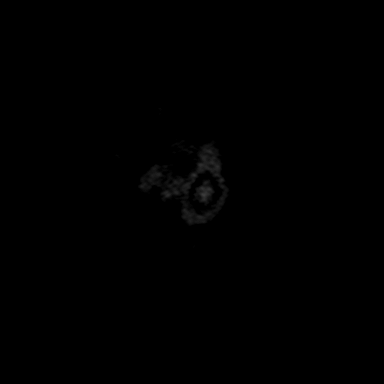

[Series 14: venous inhance coronal_msum · coronal · portal-venous · 0.9mm · 0.57mm/px · 11 of 237 slices shown]
[im 1/237]
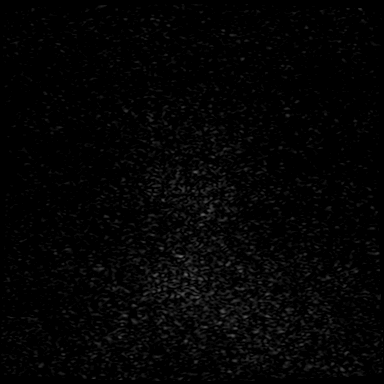
[im 14/237]
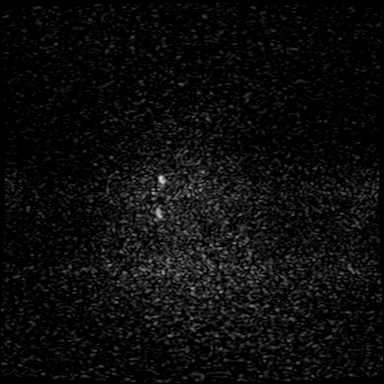
[im 42/237]
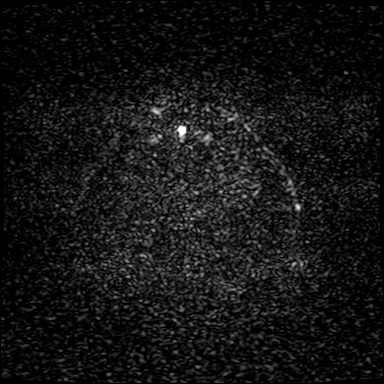
[im 70/237]
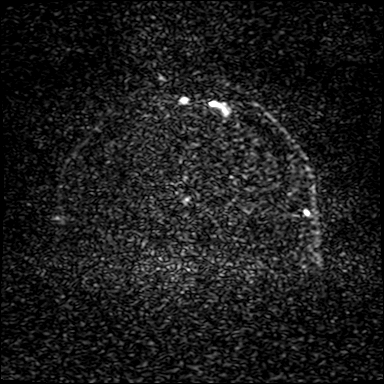
[im 98/237]
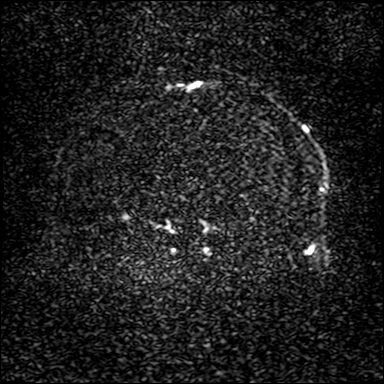
[im 125/237]
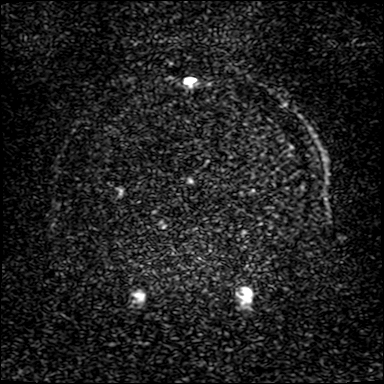
[im 139/237]
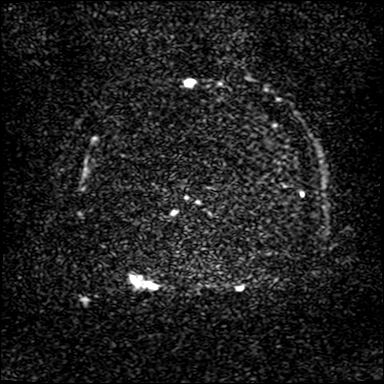
[im 167/237]
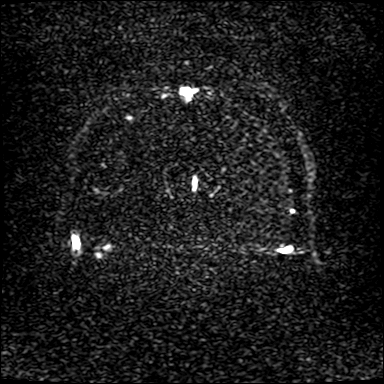
[im 195/237]
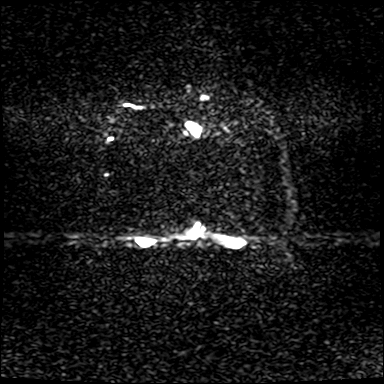
[im 209/237]
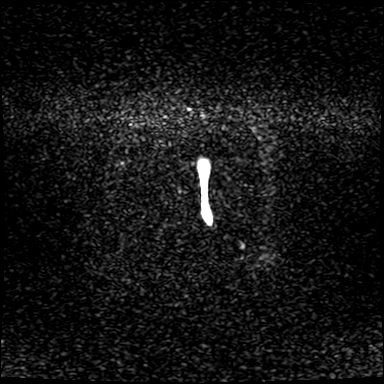
[im 223/237]
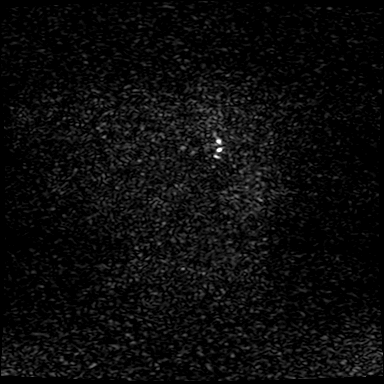

[41 of 48 positions shown; findings below may reference images not displayed]

FINDINGS: MRI HEAD WITHOUT CONTRAST

Brain: No acute infarct, mass effect or extra-axial collection. No
acute or chronic hemorrhage. Normal white matter signal, parenchymal
volume and CSF spaces. The midline structures are normal.

Vascular: Major flow voids are preserved.

Skull and upper cervical spine: Normal calvarium and skull base.
Visualized upper cervical spine and soft tissues are normal.

Sinuses/Orbits:No paranasal sinus fluid levels or advanced mucosal
thickening. No mastoid or middle ear effusion. Normal orbits.

MR VENOGRAM WITHOUT CONTRAST

Superior sagittal sinus: Normal.

Straight sinus: Normal.

Inferior sagittal sinus, vein of [place] and internal cerebral veins:
Normal.

Transverse sinuses: Focal narrowing bilaterally at the junction the
transverse and sigmoid sinuses

Sigmoid sinuses: Normal.

Visualized jugular veins: Normal.
IMPRESSION: 1. Normal MRI of the brain.
2. Focal narrowing at the junction of the transverse and sigmoid
sinuses bilaterally, possibly arachnoid granulations. MRV with
contrast or CT venogram is recommended.

## 2021-01-30 MED ORDER — LIDOCAINE-EPINEPHRINE (PF) 2 %-1:200000 IJ SOLN
10.0000 mL | Freq: Once | INTRAMUSCULAR | Status: AC
  Administered 2021-01-30: 10 mL via INTRADERMAL
  Filled 2021-01-30: qty 20

## 2021-01-30 MED ORDER — ACETAMINOPHEN 500 MG PO TABS
1000.0000 mg | ORAL_TABLET | Freq: Once | ORAL | Status: AC
  Administered 2021-01-30: 1000 mg via ORAL
  Filled 2021-01-30: qty 2

## 2021-01-30 NOTE — ED Triage Notes (Signed)
Pt sent by neurologist for further evaluation of headaches and double vision. Sent here for MRI and possible LP. Pt c.o mild headache and still having the double vision. Pt is 18 weeks preganant

## 2021-01-30 NOTE — ED Provider Notes (Signed)
[facility]   CSN: [id number] Arrival date & time: 01/30/21  1221     History Chief Complaint  Patient presents with   Headache   Diplopia    Tracy FeltsJada Keller is a 641P0 25 y.o. female, currently [redacted] weeks pregnant who presents for evaluation of headache.  Patient reports that she experienced the acute onset of severe diffuse headache on 8/25.  On 8/29, she notes that she had right-sided numbness involving her entire arm and foot.  Her numbness has subsequently resolved.  She mentions pulsatile tinnitus that was limited to that time as well.  Additionally, she reports experiencing blurred vision and diplopia that has gradually worsened since that time.  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  Patient reports that she had a similar episode of headaches last year, which was associated with left-sided numbness at that time.  She states that her symptoms had subsequently resolved without intervention.  Of note, patient was evaluated in the neurology clinic earlier this morning for the symptoms.  She was advised to present to the emergency department today due to concern for increased intracranial pressure due to IIA H versus other secondary causes.  They recommended that the patient undergo emergent MRI brain WO, MRV head WO, and LP for opening pressure.     Past Medical History:  Diagnosis Date   BMI 35.0-35.9,adult    Medical history non-contributory     Patient Active Problem List   Diagnosis Date Noted   Headache 01/31/2021   BMI 35.0-35.9,adult     Past Surgical History:  Procedure Laterality Date   WISDOM TOOTH EXTRACTION       OB History     Gravida  1   Para      Term      Preterm      AB      Living         SAB      IAB      Ectopic      Multiple      Live Births              Family History  Problem Relation Age of Onset    Pseudotumor cerebri Neg Hx     Social History   Tobacco Use   Smoking status: Never   Smokeless tobacco: Never  Substance Use Topics   Alcohol use: Never   Drug use: Never    Home Medications Prior to Admission medications   Medication Sig Start Date End Date Taking? Authorizing Provider  acetaminophen (TYLENOL) 500 MG tablet Take 1,000 mg by mouth every 6 (six) hours as needed for moderate pain or headache.   Yes [provider]  ondansetron (ZOFRAN ODT) 4 MG disintegrating tablet Take 1 tablet (4 mg total) by mouth every 6 (six) hours as needed for nausea. 11/20/20  Yes Aviva SignsWilliams, Marie L, CNM  Prenatal MV & Min w/FA-DHA (PRENATAL ADULT GUMMY/DHA/FA PO) Take 2 tablets by mouth at bedtime.   Yes [provider]    Allergies    Other  Review of Systems   Review of Systems  Constitutional:  Negative for chills and fever.  HENT:  Positive for tinnitus. Negative for ear pain and sore throat.   Eyes:  Positive for visual disturbance. Negative for pain.  Respiratory:  Negative for cough and shortness of breath.   Cardiovascular:  Negative for chest pain and  palpitations.  Gastrointestinal:  Negative for abdominal pain and vomiting.  Genitourinary:  Negative for dysuria and hematuria.  Musculoskeletal:  Negative for arthralgias and back pain.  Skin:  Negative for color change and rash.  Neurological:  Positive for numbness and headaches. Negative for seizures and syncope.  All other systems reviewed and are negative.  Physical Exam Updated Vital Signs BP (!) 100/53 (BP Location: Right Arm)   Pulse 94   Temp 99.1 F (37.3 C) (Oral)   Resp 18   Ht 5\' 3"  (1.6 m)   Wt 100.9 kg   LMP 09/21/2020   SpO2 100%   BMI 39.40 kg/m   Physical Exam Vitals and nursing note reviewed.  Constitutional:      General: She is not in acute distress.    Appearance: She is well-developed.  HENT:     Head: Normocephalic and atraumatic.  Eyes:     General: No visual field  deficit.    Conjunctiva/sclera: Conjunctivae normal.  Cardiovascular:     Rate and Rhythm: Normal rate and regular rhythm.     Heart sounds: No murmur heard. Pulmonary:     Effort: Pulmonary effort is normal. No respiratory distress.     Breath sounds: Normal breath sounds.  Abdominal:     Palpations: Abdomen is soft.     Tenderness: There is no abdominal tenderness.  Musculoskeletal:     Cervical back: Neck supple.  Skin:    General: Skin is warm and dry.  Neurological:     Mental Status: She is alert and oriented to person, place, and time.     GCS: GCS eye subscore is 4. GCS verbal subscore is 5. GCS motor subscore is 6.     Cranial Nerves: No cranial nerve deficit or dysarthria.     Sensory: No sensory deficit.     Motor: No weakness.     Coordination: Coordination normal.     Gait: Gait normal.     Deep Tendon Reflexes: Babinski sign absent on the right side. Babinski sign absent on the left side.    ED Results / Procedures / Treatments   Labs (all labs ordered are listed, but only abnormal results are displayed) Labs Reviewed  CBC WITH DIFFERENTIAL/PLATELET - Abnormal; Notable for the following components:      Result Value   WBC 12.3 (*)    MCH 25.5 (*)    Neutro Abs 9.3 (*)    All other components within normal limits  COMPREHENSIVE METABOLIC PANEL - Abnormal; Notable for the following components:   Glucose, Bld 103 (*)    BUN 5 (*)    Albumin 3.4 (*)    Total Bilirubin 0.2 (*)    All other components within normal limits  CSF CULTURE W GRAM STAIN  RESP PANEL BY RT-PCR (FLU A&B, COVID) ARPGX2  PROTIME-INR  CSF CELL COUNT WITH DIFFERENTIAL  PROTEIN AND GLUCOSE, CSF  URINALYSIS, ROUTINE W REFLEX MICROSCOPIC  HIV ANTIBODY (ROUTINE TESTING W REFLEX)    EKG None  Radiology MR BRAIN WO CONTRAST  Result Date: 01/30/2021 CLINICAL DATA:  Headache and papilledema EXAM: MRI HEAD WITHOUT CONTRAST MRV HEAD WITHOUT CONTRAST TECHNIQUE: Multiplanar, multi-echo pulse  sequences of the brain and surrounding structures were acquired without intravenous contrast. Angiographic images of the intracranial venous structures were acquired using MRV technique without intravenous contrast. COMPARISON:  No pertinent prior exam. FINDINGS: MRI HEAD WITHOUT CONTRAST Brain: No acute infarct, mass effect or extra-axial collection. No acute or chronic hemorrhage. Normal white  matter signal, parenchymal volume and CSF spaces. The midline structures are normal. Vascular: Major flow voids are preserved. Skull and upper cervical spine: Normal calvarium and skull base. Visualized upper cervical spine and soft tissues are normal. Sinuses/Orbits:No paranasal sinus fluid levels or advanced mucosal thickening. No mastoid or middle ear effusion. Normal orbits. MR VENOGRAM WITHOUT CONTRAST Superior sagittal sinus: Normal. Straight sinus: Normal. Inferior sagittal sinus, vein of Galen and internal cerebral veins: Normal. Transverse sinuses: Focal narrowing bilaterally at the junction the transverse and sigmoid sinuses Sigmoid sinuses: Normal. Visualized jugular veins: Normal. IMPRESSION: 1. Normal MRI of the brain. 2. 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 MRV with contrast or CT venogram is recommended. Electronically Signed   By: Deatra Robinson M.D.   On: 01/30/2021 21:23   MR MRV HEAD WO CM  Result Date: 01/30/2021 CLINICAL DATA:  Headache and papilledema EXAM: MRI HEAD WITHOUT CONTRAST MRV HEAD WITHOUT CONTRAST TECHNIQUE: Multiplanar, multi-echo pulse sequences of the brain and surrounding structures were acquired without intravenous contrast. Angiographic images of the intracranial venous structures were acquired using MRV technique without intravenous contrast. COMPARISON:  No pertinent prior exam. FINDINGS: MRI HEAD WITHOUT CONTRAST Brain: No acute infarct, mass effect or extra-axial collection. No acute or chronic  hemorrhage. Normal white matter signal, parenchymal volume and CSF spaces. The midline structures are normal. Vascular: Major flow voids are preserved. Skull and upper cervical spine: Normal calvarium and skull base. Visualized upper cervical spine and soft tissues are normal. Sinuses/Orbits:No paranasal sinus fluid levels or advanced mucosal thickening. No mastoid or middle ear effusion. Normal orbits. MR VENOGRAM WITHOUT CONTRAST Superior sagittal sinus: Normal. Straight sinus: Normal. Inferior sagittal sinus, vein of Galen and internal cerebral veins: Normal. Transverse sinuses: Focal narrowing bilaterally at the junction the transverse and sigmoid sinuses Sigmoid sinuses: Normal. Visualized jugular veins: Normal. IMPRESSION: 1. Normal MRI of the brain. 2. 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 MRV with contrast or CT venogram is recommended. Electronically Signed   By: Deatra Robinson M.D.   On: 01/30/2021 21:23    Procedures .Lumbar Puncture  Date/Time: 01/30/2021 11:30 PM Performed by: Holley Dexter, MD Authorized by: Lorre Nick, MD   Consent:    Consent obtained:  Written   Consent given by:  Patient   Risks, benefits, and alternatives were discussed: yes     Risks discussed:  Bleeding, infection, pain, headache, nerve damage and repeat procedure   Alternatives discussed:  No treatment, delayed treatment, alternative treatment, observation and referral Universal protocol:    Procedure explained and questions answered to patient or proxy's satisfaction: yes     Relevant documents present and verified: yes     Test results available: yes     Imaging studies available: yes     Required blood products, implants, devices, and special equipment available: yes     Immediately prior to procedure a time out was called: yes     Site/side marked: yes     Patient identity confirmed:  Verbally with patient,  hospital-assigned identification number and arm band Pre-procedure details:    Procedure purpose:  Diagnostic   Preparation: Patient was prepped and draped in usual sterile fashion   Anesthesia:    Anesthesia method:  Local infiltration   Local anesthetic:  Lidocaine 1% WITH epi Procedure details:    Lumbar space:  L3-L4 interspace   Patient position:  L lateral decubitus  Needle gauge:  20   Needle type:  Spinal needle - Quincke tip   Needle length (in):  3.5   Ultrasound guidance: no     Number of attempts:  2 Post-procedure details:    Puncture site:  Adhesive bandage applied   Procedure completion:  Procedure terminated electively by provider Comments:     Attempted LP x2 without success.   Medications Ordered in ED Medications  lidocaine-EPINEPHrine (XYLOCAINE W/EPI) 2 %-1:200000 (PF) injection 10 mL (has no administration in time range)  acetaminophen (TYLENOL) tablet 650 mg (has no administration in time range)    Or  acetaminophen (TYLENOL) suppository 650 mg (has no administration in time range)  acetaminophen (TYLENOL) tablet 1,000 mg (1,000 mg Oral Given 01/30/21 1813)    ED Course  I have reviewed the triage vital signs and the nursing notes.  Pertinent labs & imaging results that were available during my care of the patient were reviewed by me and considered in my medical decision making (see chart for details).    MDM Rules/Calculators/A&P                           25 y.o. female with past medical history as above who presents for evaluation of headache with associated with transient numbness, blurred vision, and diplopia.  Patient was evaluated in the neurology clinic earlier today and advised to present for urgent evaluation with MRI, MRV, and lumbar puncture given papilledema.  MRI and MRV demonstrated possible arachnoid granulations but no other findings.  MRI is otherwise unremarkable.  Lumbar puncture attempted, as above.  2 attempts performed without  success.  Patient will require fluoroscopy guided LP for opening pressure.  Patient will require hospital admission given concern for [facility] with vision changes, which is an emergent diagnosis.  Patient was admitted to hospitalist.  Final Clinical Impression(s) / ED Diagnoses Final diagnoses:  Intractable headache, unspecified chronicity pattern, unspecified headache type  Diplopia  [redacted] weeks gestation of pregnancy    Rx / DC Orders ED Discharge Orders     None        Holley Dexter, MD 01/31/21 1231    Lorre Nick, MD 02/02/21 2235

## 2021-01-30 NOTE — MAU Provider Note (Addendum)
Event Date/Time   First Provider Initiated Contact with Patient 01/30/21 1330      S [name] is a 25 y.o. G1P0 patient who was sent to MAU today from [facility] with complaint of blurry vision and headaches since 01/22/2021. SHe has been evaluated in MAU for these same complaints. Her OB provider, [facility] arranged referral to [facility]. She was seen at [facility] today for these complaints. The MD at [facility] requested further evaluation in the [facility]. She has no OB complaints today.  O BP 112/73 (BP Location: Right Arm)   Pulse 92   Temp 98.5 F (36.9 C) (Oral)   Resp 16   Ht 5\' 3"  (1.6 m)   Wt 100.9 kg   LMP 09/21/2020   SpO2 98% Comment: room air  BMI 39.40 kg/m   Physical Exam Constitutional:      Appearance: Normal appearance. She is obese.  HENT:     Head: Normocephalic and atraumatic.  Cardiovascular:     Rate and Rhythm: Normal rate.  Pulmonary:     Effort: Pulmonary effort is normal.  Genitourinary:    Comments: Not indicated Musculoskeletal:        General: Normal range of motion.     Cervical back: Normal range of motion.  Skin:    General: Skin is warm and dry.  Neurological:     Mental Status: She is alert and oriented to person, place, and time.  Psychiatric:        Mood and Affect: Mood normal.        Behavior: Behavior normal.        Thought Content: Thought content normal.        Judgment: Judgment normal.   FHTs by doppler: 152 bpm   A Medical screening exam complete Headache (intractable) Diplopia [redacted] weeks gestation (cleared obstetrically) *Consult with Dr. 09/23/2020 @ 1330 - notified of patient's complaints, assessments, & recommendation from her neurologist, tx plan medically screen and transfer back to [facility] - ok to transfer, agrees with plan   P Discharge from MAU in stable condition Patient to be transferred back to [facility] for further evaluation  Warning signs for worsening obstetrical condition that would warrant  emergency follow-up discussed Patient may return to MAU as needed for obstetrical complaints  [facility], CNM 01/30/2021 1:30 PM

## 2021-01-30 NOTE — MAU Note (Signed)
Tracy Keller is a 25 y.o. at [redacted]w[redacted]d here in MAU reporting: was sent by neurologist today and states she was sent over for a scan and spinal tap. Has headaches at night while laying down, currently having neck and back pain. Is having double vision. No OB complaints.   Onset of complaint: ongoing  Pain score: 7/10  Vitals:   01/30/21 1225 01/30/21 1318  BP: 135/74 112/73  Pulse: (!) 109 92  Resp: 16 16  Temp: 98.9 F (37.2 C) 98.5 F (36.9 C)  SpO2: 99% 98%     FHT:152  Lab orders placed from triage: none

## 2021-01-30 NOTE — Progress Notes (Addendum)
[facility]  PATIENT: Tracy Keller DOB: [birth year]  REFERRING CLINICIAN: Gerrit Heck, CNM HISTORY FROM: patient  REASON FOR VISIT: new consult    HISTORICAL  CHIEF COMPLAINT:  Chief Complaint  Patient presents with   New Patient (Initial Visit)    RM 7, alone. Internal referral for blurry vision/headaches. Headaches occur at night when she is trying to go to sleep. Since blurry started about 5 days ago, having neck/back pain. Having pounding with headaches. Currently pregnant (18 weeks and 4 or 5 days). Saw eye doctor yesterday who told her she had papilledema. Has had numbness on whole right side intermittently (occurred before blurry vision sometime last week)    HISTORY OF PRESENT ILLNESS:   25 year old G1P0 female, currently [redacted] weeks pregnant, here for evaluation headaches, numbness, papilledema and vision problems.  Symptoms suddenly started on 01/22/21 with headaches. Then on 01/26/2021 had with right sided numbness and blurred vision.  She also noticed horizontal double vision with both eyes open.  In the morning when she wakes up she cannot see out of her right eye but this gradually improves.  She has been having global headaches as well.  Patient went to OB/GYN clinic for evaluation on 01/26/21, and was recommended to go to the emergency room, but when she got there the wait was too long and so she went home. She also went to optometrist on 01/28/2021, who detected papilledema on the right side more than the left.  Patient was also referred here urgently for evaluation.  Currently patient is having blurred vision, double vision, headaches.  No ringing in ears.  Last year patient had an episode of headaches associated with numbness on the left side.  EMS came to her home, but symptoms spontaneously resolved and she did not seek further medical attention for this.   REVIEW OF SYSTEMS: Full 14 system review of systems performed and negative with exception of:  As per HPI.  ALLERGIES: Allergies  Allergen Reactions   Other     seasonal allergies    HOME MEDICATIONS: Outpatient Medications Prior to Visit  Medication Sig Dispense Refill   ondansetron (ZOFRAN ODT) 4 MG disintegrating tablet Take 1 tablet (4 mg total) by mouth every 6 (six) hours as needed for nausea. 20 tablet 0   Prenatal Vit-Fe Fumarate-FA (PRENATAL MULTIVITAMIN) TABS tablet Take 1 tablet by mouth daily at 12 noon.     Ascorbic Acid (VITAMIN C ADULT GUMMIES PO) Take 1 tablet by mouth daily.     docusate sodium (COLACE) 100 MG capsule Take 1 capsule (100 mg total) by mouth 2 (two) times daily as needed. 30 capsule 2   Doxylamine-Pyridoxine (DICLEGIS) 10-10 MG TBEC Take 2 tablets by mouth at bedtime as needed (nausea). 60 tablet 0   No facility-administered medications prior to visit.    PAST MEDICAL HISTORY: Past Medical History:  Diagnosis Date   BMI 35.0-35.9,adult    Medical history non-contributory     PAST SURGICAL HISTORY: Past Surgical History:  Procedure Laterality Date   WISDOM TOOTH EXTRACTION      FAMILY HISTORY: History reviewed. No pertinent family history.  SOCIAL HISTORY: Social History   Socioeconomic History   Marital status: Single    Spouse name: Not on file   Number of children: Not on file   Years of education: Not on file   Highest education level: Not on file  Occupational History   Not on file  Tobacco Use   Smoking status: Never   Smokeless  tobacco: Never  Substance and Sexual Activity   Alcohol use: Never   Drug use: Never   Sexual activity: Yes  Other Topics Concern   Not on file  Social History Narrative   Right handed   Coffee/iced tea sometimes   Lives with boyfriend   Social Determinants of Health   Financial Resource Strain: Not on file  Food Insecurity: Not on file  Transportation Needs: Not on file  Physical Activity: Not on file  Stress: Not on file  Social Connections: Not on file  Intimate Partner  Violence: Not on file     PHYSICAL EXAM  GENERAL EXAM/CONSTITUTIONAL: Vitals:  Vitals:   01/30/21 0830  BP: 121/70  Pulse: (!) 101  Weight: 222 lb 9.6 oz (101 kg)  Height: 5\' 3"  (1.6 m)   Body mass index is 39.43 kg/m. Wt Readings from Last 3 Encounters:  01/30/21 222 lb 9.6 oz (101 kg)  01/26/21 225 lb 6.4 oz (102.2 kg)  01/22/21 223 lb (101.2 kg)   Patient is in no distress; well developed, nourished and groomed; neck is supple  CARDIOVASCULAR: Examination of carotid arteries is normal; no carotid bruits Regular rate and rhythm, no murmurs Examination of peripheral vascular system by observation and palpation is normal  EYES: Vision Screening   Right eye Left eye Both eyes  Without correction     With correction 20/20 20/30 20/20     MUSCULOSKELETAL: Gait, strength, tone, movements noted in Neurologic exam below  NEUROLOGIC: MENTAL STATUS:  No flowsheet data found. awake, alert, oriented to person, place and time recent and remote memory intact normal attention and concentration language fluent, comprehension intact, naming intact fund of knowledge appropriate  CRANIAL NERVE:  2nd - MILD PAPILLEDEMA RIGHT > LEFT; PHOTOSENSITIVE 2nd, 3rd, 4th, 6th - pupils equal and reactive to light, visual fields full to confrontation, extraocular muscles --> DOUBLE VISION WITH BOTH EYES OPEN; SLIGHTLY DECR ABDUCTION OF RIGHT AND LEFT EYES ON LATERAL GAZES; no nystagmus 5th - facial sensation symmetric 7th - facial strength symmetric 8th - hearing intact 9th - palate elevates symmetrically, uvula midline 11th - shoulder shrug symmetric 12th - tongue protrusion midline  MOTOR:  normal bulk and tone, full strength in the BUE, BLE  SENSORY:  normal and symmetric to light touch, temperature, vibration  COORDINATION:  finger-nose-finger, fine finger movements normal  REFLEXES:  deep tendon reflexes 1+ and symmetric  GAIT/STATION:  narrow based  gait     DIAGNOSTIC DATA (LABS, IMAGING, TESTING) - I reviewed patient records, labs, notes, testing and imaging myself where available.  Lab Results  Component Value Date   WBC 15.1 (H) 11/20/2020   HGB 12.2 11/20/2020   HCT 39.8 11/20/2020   MCV 84.5 11/20/2020   PLT 282 11/20/2020      Component Value Date/Time   NA 133 (L) 11/20/2020 0541   K 3.6 11/20/2020 0541   CL 103 11/20/2020 0541   CO2 24 11/20/2020 0541   GLUCOSE 102 (H) 11/20/2020 0541   BUN 8 11/20/2020 0541   CREATININE 0.61 11/20/2020 0541   CALCIUM 9.2 11/20/2020 0541   PROT 6.6 11/20/2020 0541   ALBUMIN 3.7 11/20/2020 0541   AST 25 11/20/2020 0541   ALT 25 11/20/2020 0541   ALKPHOS 62 11/20/2020 0541   BILITOT 0.2 (L) 11/20/2020 0541   GFRNONAA >60 11/20/2020 0541   No results found for: CHOL, HDL, LDLCALC, LDLDIRECT, TRIG, CHOLHDL No results found for: 11/22/2020 No results found for: VITAMINB12 No results found for:  TSH     ASSESSMENT AND PLAN  25 y.o. year old female here with new onset of headaches, right-sided numbness, papilledema, double vision, transient visual obscuration, since 01/26/2021.  Concern for increased intracranial pressure due to idiopathic intracranial hypertension (pseudotumor cerebri) versus other secondary cause.  Dx:  1. Acute nonintractable headache, unspecified headache type   2. Numbness   3. Papilledema      PLAN:  NEW ONSET HEADACHES, RIGHT SIDED NUMBNESS, PAPILLEDEMA, DOUBLE VISION, TRANSIENT VISUAL OBSCURATION (? Bilateral CN6 palsies) since 01/26/21 - concern for increased intracranial pressure - recommend urgent ER evaluation for MRI brain (without), MRV head (without), and lumbar puncture (opening pressure); patient will go to [facility] ER via private car from here; I called [facility] to let them know she was coming as well.  Return in about 1 month (around 03/01/2021).  I reviewed images, labs, notes, records myself. I summarized findings and reviewed with patient,  for this high risk condition (headache, vision loss, papilledema) requiring high complexity decision making.    Suanne Marker, MD 01/30/2021, 9:24 AM Certified in Neurology, Neurophysiology and Neuroimaging  [facility] [address], [address] [place], [place] [address] [phone]

## 2021-01-30 NOTE — ED Provider Notes (Signed)
I saw and evaluated the patient, reviewed the resident's note and I agree with the findings and plan.  25 year old female presents from neurology due to diplopia and vision loss.  Concern for possible pseudotumor.  Plan right now is to have MRI/MRV.  If those are negative, patient will need to have LP with opening pressure.  Will likely require inpatient hospitalization   Lorre Nick, MD 01/30/21 1621

## 2021-01-30 NOTE — Patient Instructions (Addendum)
NEW ONSET HEADACHES, RIGHT SIDED NUMBNESS, PAPILLEDEMA, DOUBLE VISION, TRANSIENT VISUAL OBSCURATION (? Bilateral CN6 palsies) since 01/26/21 - concern for increased intracranial pressure - recommend urgent ER evaluation for MRI brain (without), MRV head (without), and lumbar puncture (opening pressure); patient will go to [facility] ER via private car from here

## 2021-01-30 NOTE — Telephone Encounter (Signed)
When checking out I informed patient a nurse will be calling her on Tuesday to schedule a one Monday follow-up with Dr. Marjory Lies.

## 2021-01-30 NOTE — ED Provider Notes (Addendum)
Emergency Medicine Provider Triage Evaluation Note  Tracy Keller , a 25 y.o. female  was evaluated in triage.  Pt complains of headaches and blurry vision. Symptoms started 1 week ago and have been getting progressively worse. She was sent here by neurologist for MRI and possible LP. She is [redacted] weeks pregnant.  Review of Systems  Positive: Headache, blurry vision Negative: Numbness, weakness  Physical Exam  BP 135/74   Pulse (!) 109   Temp 98.9 F (37.2 C) (Oral)   Resp 16   Ht 5\' 3"  (1.6 m)   Wt 100.7 kg   LMP 09/21/2020   SpO2 99%   BMI 39.33 kg/m  Gen:   Awake, no distress   Resp:  Normal effort  MSK:   Moves extremities without difficulty  Other:  5/5 strength to upper and lower extremities  Medical Decision Making  Medically screening exam initiated at 12:33 PM.  Appropriate orders placed.  Celenia Hruska was informed that the remainder of the evaluation will be completed by another provider, this initial triage assessment does not replace that evaluation, and the importance of remaining in the ED until their evaluation is complete.  Discussed with [facility] APP who agreed to take patient     Tana Felts 01/30/21 1236    Keyshaun Exley T, PA-C 01/30/21 1239    04/01/21, MD 01/30/21 [phone]

## 2021-01-30 NOTE — ED Notes (Signed)
Patient transported to MRI 

## 2021-01-31 ENCOUNTER — Encounter (HOSPITAL_COMMUNITY): Payer: Self-pay | Admitting: Internal Medicine

## 2021-01-31 ENCOUNTER — Observation Stay (HOSPITAL_COMMUNITY): Payer: BC Managed Care – PPO

## 2021-01-31 DIAGNOSIS — Z3A18 18 weeks gestation of pregnancy: Secondary | ICD-10-CM | POA: Diagnosis not present

## 2021-01-31 DIAGNOSIS — H532 Diplopia: Secondary | ICD-10-CM | POA: Diagnosis present

## 2021-01-31 DIAGNOSIS — Z20822 Contact with and (suspected) exposure to covid-19: Secondary | ICD-10-CM | POA: Diagnosis present

## 2021-01-31 DIAGNOSIS — G932 Benign intracranial hypertension: Secondary | ICD-10-CM

## 2021-01-31 DIAGNOSIS — H547 Unspecified visual loss: Secondary | ICD-10-CM | POA: Diagnosis present

## 2021-01-31 DIAGNOSIS — R519 Headache, unspecified: Secondary | ICD-10-CM

## 2021-01-31 DIAGNOSIS — Z79899 Other long term (current) drug therapy: Secondary | ICD-10-CM | POA: Diagnosis not present

## 2021-01-31 DIAGNOSIS — O99352 Diseases of the nervous system complicating pregnancy, second trimester: Secondary | ICD-10-CM | POA: Diagnosis present

## 2021-01-31 LAB — CSF CELL COUNT WITH DIFFERENTIAL
RBC Count, CSF: 1 /mm3 — ABNORMAL HIGH
Tube #: 3
WBC, CSF: 1 /mm3 (ref 0–5)

## 2021-01-31 LAB — URINALYSIS, ROUTINE W REFLEX MICROSCOPIC
Bilirubin Urine: NEGATIVE
Glucose, UA: NEGATIVE mg/dL
Hgb urine dipstick: NEGATIVE
Ketones, ur: NEGATIVE mg/dL
Leukocytes,Ua: NEGATIVE
Nitrite: NEGATIVE
Protein, ur: NEGATIVE mg/dL
Specific Gravity, Urine: 1.025 (ref 1.005–1.030)
pH: 6 (ref 5.0–8.0)

## 2021-01-31 LAB — CBC WITH DIFFERENTIAL/PLATELET
Abs Immature Granulocytes: 0.07 10*3/uL (ref 0.00–0.07)
Basophils Absolute: 0 10*3/uL (ref 0.0–0.1)
Basophils Relative: 0 %
Eosinophils Absolute: 0 10*3/uL (ref 0.0–0.5)
Eosinophils Relative: 0 %
HCT: 34.4 % — ABNORMAL LOW (ref 36.0–46.0)
Hemoglobin: 10.6 g/dL — ABNORMAL LOW (ref 12.0–15.0)
Immature Granulocytes: 1 %
Lymphocytes Relative: 23 %
Lymphs Abs: 2.9 10*3/uL (ref 0.7–4.0)
MCH: 25.8 pg — ABNORMAL LOW (ref 26.0–34.0)
MCHC: 30.8 g/dL (ref 30.0–36.0)
MCV: 83.7 fL (ref 80.0–100.0)
Monocytes Absolute: 0.9 10*3/uL (ref 0.1–1.0)
Monocytes Relative: 7 %
Neutro Abs: 8.4 10*3/uL — ABNORMAL HIGH (ref 1.7–7.7)
Neutrophils Relative %: 69 %
Platelets: 250 10*3/uL (ref 150–400)
RBC: 4.11 MIL/uL (ref 3.87–5.11)
RDW: 15.6 % — ABNORMAL HIGH (ref 11.5–15.5)
WBC: 12.3 10*3/uL — ABNORMAL HIGH (ref 4.0–10.5)
nRBC: 0 % (ref 0.0–0.2)

## 2021-01-31 LAB — BASIC METABOLIC PANEL
Anion gap: 8 (ref 5–15)
BUN: 7 mg/dL (ref 6–20)
CO2: 23 mmol/L (ref 22–32)
Calcium: 9.2 mg/dL (ref 8.9–10.3)
Chloride: 106 mmol/L (ref 98–111)
Creatinine, Ser: 0.54 mg/dL (ref 0.44–1.00)
GFR, Estimated: >60 mL/min (ref >60)
Glucose, Bld: 93 mg/dL (ref 70–99)
Potassium: 3.9 mmol/L (ref 3.5–5.1)
Sodium: 137 mmol/L (ref 135–145)

## 2021-01-31 LAB — MAGNESIUM: Magnesium: 1.7 mg/dL (ref 1.7–2.4)

## 2021-01-31 LAB — PROTEIN, CSF: Total  Protein, CSF: 17 mg/dL (ref 15–45)

## 2021-01-31 LAB — RESP PANEL BY RT-PCR (FLU A&B, COVID) ARPGX2
Influenza A by PCR: NEGATIVE
Influenza B by PCR: NEGATIVE
SARS Coronavirus 2 by RT PCR: NEGATIVE

## 2021-01-31 LAB — GLUCOSE, CSF: Glucose, CSF: 54 mg/dL (ref 40–70)

## 2021-01-31 LAB — HIV ANTIBODY (ROUTINE TESTING W REFLEX): HIV Screen 4th Generation wRfx: NONREACTIVE

## 2021-01-31 IMAGING — RF DG FLUORO GUIDE SPINAL/SI JT INJ*L*
1 series · 2 of 2 positions shown · non-contrast
Comparison: Cranial MRI and MRV [DATE].

CLINICAL DATA: Headaches with papilledema and double vision.
Nineteen weeks pregnant.

EXAM:
DIAGNOSTIC LUMBAR PUNCTURE UNDER FLUOROSCOPIC GUIDANCE

[Series 1: cp_standard · 0.17mm/px · 2 of 2 slices shown]
[im 1/2]
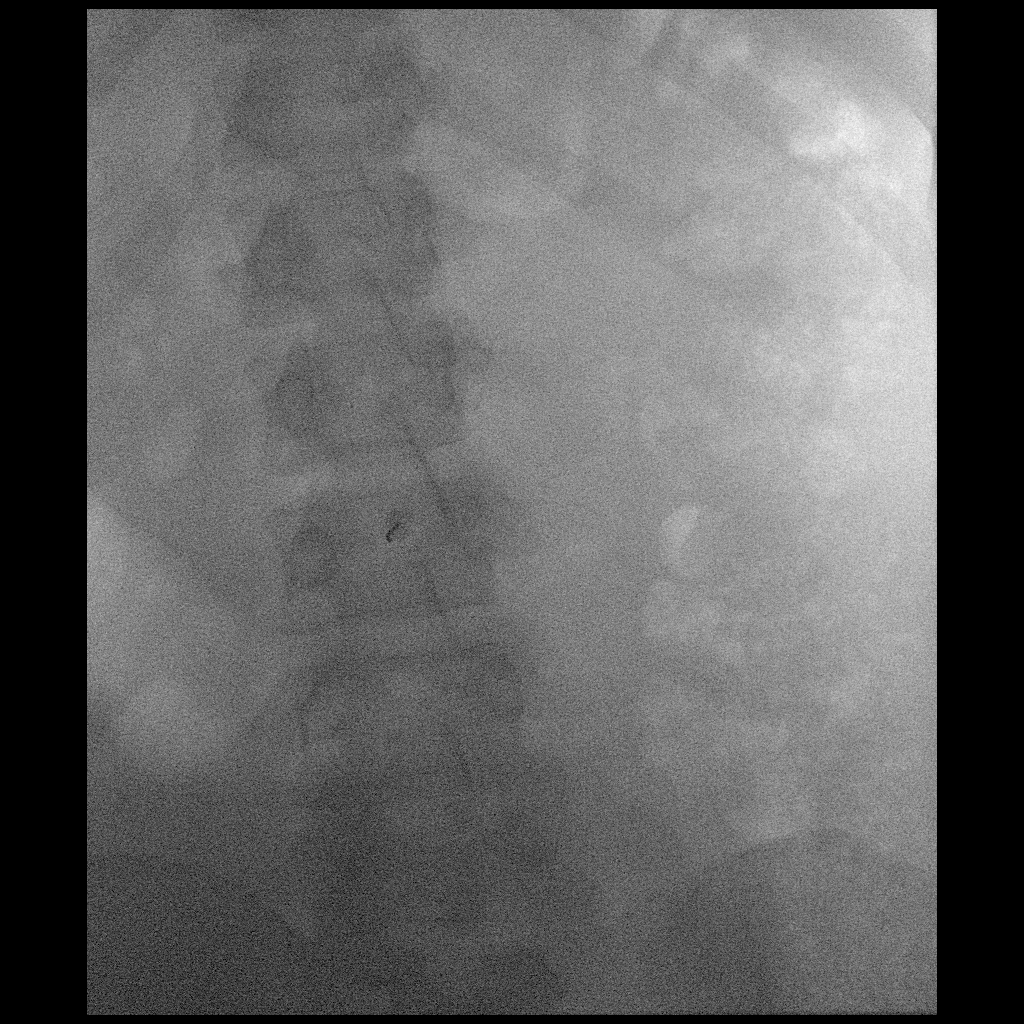
[im 2/2]
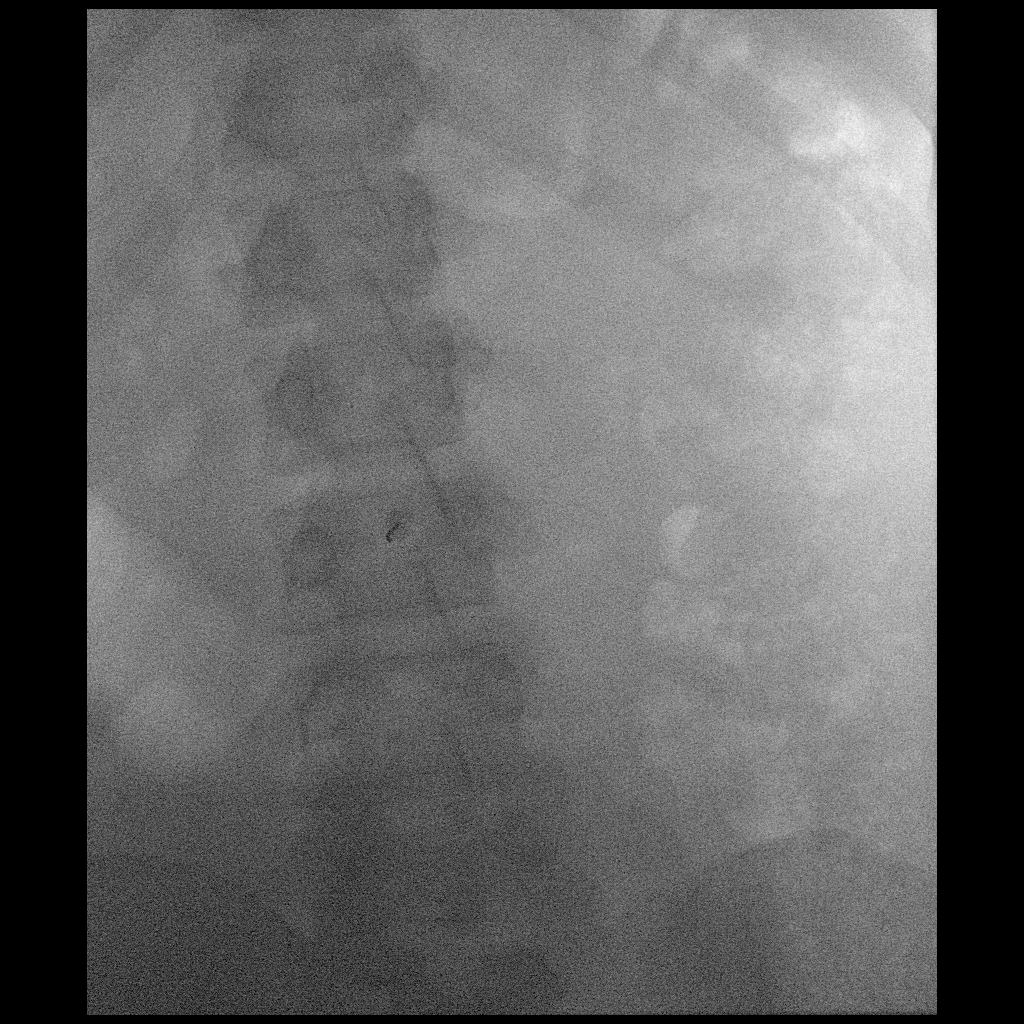

[2 of 2 positions shown; findings below may reference images not displayed]

FLUOROSCOPY TIME:  Fluoroscopy Time: 12 seconds of low-dose pulsed
fluoroscopy

Radiation Exposure Index (if provided by the fluoroscopic device):
2.7 mGy

Number of Acquired Spot Images: 0

PROCEDURE:
Standard time-out was employed. I discussed the risks (including
hemorrhage, infection, headache, and nerve damage, among others),
benefits, and alternatives to fluoroscopically guided lumbar
puncture with the patient. We specifically discussed the high
technical likelihood of success of the procedure and the low risks
of low-dose radiation in the 2nd trimester of pregnancy. The patient
understood and elected to undergo the procedure.

An appropriate site for lumbar puncture was marked on the patient's
skin under fluoroscopic guidance. Following sterile skin prep and
local anesthetic administration consisting of 1 percent lidocaine, a
22 gauge spinal needle was advanced without difficulty into the
thecal sac at the at the L2-3 level under fluoroscopic guidance.
Clear CSF was returned. Opening pressure was elevated at 41 cm
water, measured prone through the needle.

23 cc of clear CSF was collected. At that point, the pressure no
longer seemed elevated, and there was limited CSF return. The needle
was subsequently removed and the skin cleansed and bandaged. No
immediate complications were observed.
IMPRESSION: 1. Diagnostic lumbar puncture performed without complication. CSF
samples sent to the laboratory.
2. Elevated opening pressure.

## 2021-01-31 MED ORDER — ACETAMINOPHEN 325 MG PO TABS: 650.0000 mg | ORAL_TABLET | Freq: Four times a day (QID) | ORAL | Status: DC | PRN

## 2021-01-31 MED ORDER — MAGNESIUM SULFATE 2 GM/50ML IV SOLN
2.0000 g | Freq: Once | INTRAVENOUS | Status: AC
  Administered 2021-01-31: 2 g via INTRAVENOUS
  Filled 2021-01-31: qty 50

## 2021-01-31 MED ORDER — LIDOCAINE HCL (PF) 1 % IJ SOLN
INTRAMUSCULAR | Status: AC
  Filled 2021-01-31: qty 5

## 2021-01-31 MED ORDER — ACETAMINOPHEN 650 MG RE SUPP: 650.0000 mg | Freq: Four times a day (QID) | RECTAL | Status: DC | PRN

## 2021-01-31 MED ORDER — ONDANSETRON HCL 4 MG/2ML IJ SOLN: 4.0000 mg | Freq: Four times a day (QID) | INTRAMUSCULAR | Status: DC | PRN

## 2021-01-31 MED ORDER — ONDANSETRON 4 MG PO TBDP
4.0000 mg | ORAL_TABLET | Freq: Four times a day (QID) | ORAL | Status: DC | PRN
  Administered 2021-01-31: 4 mg via ORAL
  Filled 2021-01-31: qty 1

## 2021-01-31 MED ORDER — COMPLETENATE 29-1 MG PO CHEW
1.0000 | CHEWABLE_TABLET | Freq: Every day | ORAL | Status: DC
  Administered 2021-01-31: 1 via ORAL
  Filled 2021-01-31 (×2): qty 1

## 2021-01-31 MED ORDER — LIDOCAINE HCL (PF) 1 % IJ SOLN
INTRAMUSCULAR | Status: AC
  Administered 2021-01-31: 2 mL via INTRAMUSCULAR
  Filled 2021-01-31: qty 5

## 2021-01-31 MED ORDER — MAGNESIUM SULFATE 2 GM/50ML IV SOLN
2.0000 g | Freq: Once | INTRAVENOUS | Status: DC
  Filled 2021-01-31: qty 50

## 2021-01-31 NOTE — ED Notes (Signed)
Patient transported to X-ray 

## 2021-01-31 NOTE — Procedures (Signed)
Lumbar puncture performed without complication at L2/3. Opening pressure elevated at 41 cm measured prone through needle. See Radiology report for details.

## 2021-01-31 NOTE — Progress Notes (Signed)
   01/31/21 2231  Provider Notification  Provider Name/Title Bruna Potter, NP  Date Provider Notified 01/31/21  Time Provider Notified 2231  Notification Type Page  Notification Reason Other (Comment) (unable to tolerate Mg IVPB infusion.)  Provider response Other (Comment) (waiting for response)   Patient unable to tolerate Mg IVPB infusion. Tried to infuse it a much lower rate, pt continue to c/o discomfort at IV site. She received half of the 50 ml bag. Blount, NP notified via secure page. Waiting for response.

## 2021-01-31 NOTE — ED Notes (Signed)
Breakfast Ordered 

## 2021-01-31 NOTE — ED Notes (Addendum)
Pt wanting to leave, Dr. Julian Reil notified and to pt's room. Pt states if she can get a hospital bed and pillows, she will stay.

## 2021-01-31 NOTE — H&P (Signed)
History and Physical    Tracy Keller ZSW:[id number] DOB: 1995-06-09 DOA: 01/30/2021  PCP: Pcp, No  Patient coming from: Home  I have personally briefly reviewed patient's old medical records in [facility]  Chief Complaint: Headache  HPI: Tracy Keller is a 25 y.o. female with medical history significant of [redacted] weeks pregnant.  Pt developed headache around the 25th of Aug.  Then developed numbness and tingling of R side on the 29th.  Pt seen for blurry vision on the 30th.  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  Patient reports that she had a similar episode of headaches last year, which was associated with left-sided numbness at that time.  She states that her symptoms had subsequently resolved without intervention.  She saw neurology clinic earlier this morning for symptoms who promptly sent her in to the ED due to concern for possible IIH with visual changes.  No fevers, chills.   ED Course: MRI brain and MRV w/o contrast were neg for acute findings.  Did have "Focal narrowing at the junction of the transverse and sigmoid sinuses bilaterally, possibly arachnoid granulations. MRV with contrast or CT venogram is recommended."  LP attempted by EDP but unsuccessful.  Hospitalist asked to admit for IR to do LP later this AM.   Review of Systems: As per HPI, otherwise all review of systems negative.  Past Medical History:  Diagnosis Date   BMI 35.0-35.9,adult    Medical history non-contributory     Past Surgical History:  Procedure Laterality Date   WISDOM TOOTH EXTRACTION       reports that she has never smoked. She has never used smokeless tobacco. She reports that she does not drink alcohol and does not use drugs.  Allergies  Allergen Reactions   Other     seasonal allergies    Family History  Problem Relation Age of Onset   Pseudotumor cerebri Neg Hx      Prior to Admission medications    Medication Sig Start Date End Date Taking? Authorizing Provider  acetaminophen (TYLENOL) 500 MG tablet Take 1,000 mg by mouth every 6 (six) hours as needed for moderate pain or headache.   Yes [provider]  ondansetron (ZOFRAN ODT) 4 MG disintegrating tablet Take 1 tablet (4 mg total) by mouth every 6 (six) hours as needed for nausea. 11/20/20  Yes Tracy Keller, CNM  Prenatal MV & Min w/FA-DHA (PRENATAL ADULT GUMMY/DHA/FA PO) Take 2 tablets by mouth at bedtime.   Yes [provider]    Physical Exam: Vitals:   01/30/21 1915 01/30/21 1930 01/30/21 2133 01/31/21 0030  BP: (!) 121/55 101/73 114/74 (!) 118/95  Pulse: 93 (!) 101 89 98  Resp:  17 18 17   Temp:      TempSrc:      SpO2: 99% 98% 100% 98%  Weight:      Height:        Constitutional: NAD, calm, comfortable Eyes: PERRL, lids and conjunctivae normal ENMT: Mucous membranes are moist. Posterior pharynx clear of any exudate or lesions.Normal dentition.  Neck: normal, supple, no masses, no thyromegaly Respiratory: clear to auscultation bilaterally, no wheezing, no crackles. Normal respiratory effort. No accessory muscle use.  Cardiovascular: Regular rate and rhythm, no murmurs / rubs / gallops. No extremity edema. 2+ pedal pulses. No carotid bruits.  Abdomen: no tenderness, no masses palpated. No hepatosplenomegaly. Bowel sounds positive.  Musculoskeletal: no clubbing / cyanosis. No  joint deformity upper and lower extremities. Good ROM, no contractures. Normal muscle tone.  Skin: no rashes, lesions, ulcers. No induration Neurologic: CN 2-12 grossly intact. Sensation intact, DTR normal. Strength 5/5 in all 4.  Psychiatric: Normal judgment and insight. Alert and oriented x 3. Normal mood.    Labs on Admission: I have personally reviewed following labs and imaging studies  CBC: Recent Labs  Lab 01/30/21 1610  WBC 12.3*  NEUTROABS 9.3*  HGB 12.2  HCT 39.6  MCV 82.8  PLT 284   Basic Metabolic  Panel: Recent Labs  Lab 01/30/21 1610  NA 136  K 3.7  CL 104  CO2 23  GLUCOSE 103*  BUN 5*  CREATININE 0.63  CALCIUM 9.6   GFR: Estimated Creatinine Clearance: 125 mL/min (by C-G formula based on SCr of 0.63 mg/dL). Liver Function Tests: Recent Labs  Lab 01/30/21 1610  AST 20  ALT 24  ALKPHOS 70  BILITOT 0.2*  PROT 7.1  ALBUMIN 3.4*   No results for input(s): LIPASE, AMYLASE in the last 168 hours. No results for input(s): AMMONIA in the last 168 hours. Coagulation Profile: Recent Labs  Lab 01/30/21 1610  INR 1.1   Cardiac Enzymes: No results for input(s): CKTOTAL, CKMB, CKMBINDEX, TROPONINI in the last 168 hours. BNP (last 3 results) No results for input(s): PROBNP in the last 8760 hours. HbA1C: No results for input(s): HGBA1C in the last 72 hours. CBG: No results for input(s): GLUCAP in the last 168 hours. Lipid Profile: No results for input(s): CHOL, HDL, LDLCALC, TRIG, CHOLHDL, LDLDIRECT in the last 72 hours. Thyroid Function Tests: No results for input(s): TSH, T4TOTAL, FREET4, T3FREE, THYROIDAB in the last 72 hours. Anemia Panel: No results for input(s): VITAMINB12, FOLATE, FERRITIN, TIBC, IRON, RETICCTPCT in the last 72 hours. Urine analysis:    Component Value Date/Time   COLORURINE YELLOW 01/22/2021 2050   APPEARANCEUR CLOUDY (A) 01/22/2021 2050   LABSPEC 1.025 01/22/2021 2050   PHURINE 6.0 01/22/2021 2050   GLUCOSEU NEGATIVE 01/22/2021 2050   HGBUR NEGATIVE 01/22/2021 2050   BILIRUBINUR NEGATIVE 01/22/2021 2050   KETONESUR NEGATIVE 01/22/2021 2050   PROTEINUR NEGATIVE 01/22/2021 2050   NITRITE NEGATIVE 01/22/2021 2050   LEUKOCYTESUR NEGATIVE 01/22/2021 2050    Radiological Exams on Admission: MR BRAIN WO CONTRAST  Result Date: 01/30/2021 CLINICAL DATA:  Headache and papilledema EXAM: MRI HEAD WITHOUT CONTRAST MRV HEAD WITHOUT CONTRAST TECHNIQUE: Multiplanar, multi-echo pulse sequences of the brain and surrounding structures were acquired  without intravenous contrast. Angiographic images of the intracranial venous structures were acquired using MRV technique without intravenous contrast. COMPARISON:  No pertinent prior exam. FINDINGS: MRI HEAD WITHOUT CONTRAST Brain: No acute infarct, mass effect or extra-axial collection. No acute or chronic hemorrhage. Normal white matter signal, parenchymal volume and CSF spaces. The midline structures are normal. Vascular: Major flow voids are preserved. Skull and upper cervical spine: Normal calvarium and skull base. Visualized upper cervical spine and soft tissues are normal. Sinuses/Orbits:No paranasal sinus fluid levels or advanced mucosal thickening. No mastoid or middle ear effusion. Normal orbits. MR VENOGRAM WITHOUT CONTRAST Superior sagittal sinus: Normal. Straight sinus: Normal. Inferior sagittal sinus, vein of Galen and internal cerebral veins: Normal. Transverse sinuses: Focal narrowing bilaterally at the junction the transverse and sigmoid sinuses Sigmoid sinuses: Normal. Visualized jugular veins: Normal. IMPRESSION: 1. Normal MRI of the brain. 2. 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 MRV with contrast or CT venogram is recommended. Electronically Signed  By: Deatra Robinson M.D.   On: 01/30/2021 21:23   MR MRV HEAD WO CM  Result Date: 01/30/2021 CLINICAL DATA:  Headache and papilledema EXAM: MRI HEAD WITHOUT CONTRAST MRV HEAD WITHOUT CONTRAST TECHNIQUE: Multiplanar, multi-echo pulse sequences of the brain and surrounding structures were acquired without intravenous contrast. Angiographic images of the intracranial venous structures were acquired using MRV technique without intravenous contrast. COMPARISON:  No pertinent prior exam. FINDINGS: MRI HEAD WITHOUT CONTRAST Brain: No acute infarct, mass effect or extra-axial collection. No acute or chronic hemorrhage. Normal white matter signal, parenchymal volume and CSF  spaces. The midline structures are normal. Vascular: Major flow voids are preserved. Skull and upper cervical spine: Normal calvarium and skull base. Visualized upper cervical spine and soft tissues are normal. Sinuses/Orbits:No paranasal sinus fluid levels or advanced mucosal thickening. No mastoid or middle ear effusion. Normal orbits. MR VENOGRAM WITHOUT CONTRAST Superior sagittal sinus: Normal. Straight sinus: Normal. Inferior sagittal sinus, vein of Galen and internal cerebral veins: Normal. Transverse sinuses: Focal narrowing bilaterally at the junction the transverse and sigmoid sinuses Sigmoid sinuses: Normal. Visualized jugular veins: Normal. IMPRESSION: 1. Normal MRI of the brain. 2. 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 MRV with contrast or CT venogram is recommended. Electronically Signed   By: Deatra Robinson M.D.   On: 01/30/2021 21:23    EKG: Independently reviewed.  Assessment/Plan Active Problems:   Headache    Headache, papiledema, vision changes - Concern for IIH DDx includes viral meningitis, tension headache, and migraine Bacterial meningitis is unlikely: Pt with over 1 week of symptoms now, no SIRS and clinically stable MRI/MRV reviewed with neurology: neurology did not feel that MRV with contrast was needed at this time (findings today are not consistent with a large sized venous thrombus we were looking for that would be needed to cause papilledema) IR to perform LP later this AM, obtain opening pressures. If IIH confirmed: Due to pregnancy, topamax is contraindicated, acetazolamide is also relatively contraindicated at this time. Treatment may need to be serial scheduled LPs for next few weeks or possibly even duration of pregnancy.  DVT prophylaxis: SCDs Code Status: Full Family Communication: Husband at bedside Disposition Plan: Home after LP and diagnosis Consults called:  Neurology Admission status: Place in 78   Heavenlee Maiorana M. DO [facility]  How to contact the [facility] Attending or Consulting provider 7A - 7P or covering provider during after hours 7P -7A, for this patient?  Check the care team in [facility] and look for a) attending/consulting [facility] provider listed and b) the [facility] team listed Log into [email or web address]  Amion Physician Scheduling and messaging for groups and whole hospitals  On call and physician scheduling software for group practices, residents, hospitalists and other medical providers for call, clinic, rotation and shift schedules. On[employer] is a hospital-wide system for scheduling doctors and paging doctors on call. EasyPlot is for scientific plotting and data analysis.  [email or web address]  and use [facility] universal password to access. If you do not have the password, please contact the hospital operator.  Locate the [facility] provider you are looking for under [facility] and page to a number that you can be directly reached. If you still have difficulty reaching the provider, please page the [facility] (Director on Call) for the Hospitalists listed on amion for assistance.  01/31/2021, 1:26 AM

## 2021-01-31 NOTE — Consult Note (Addendum)
NEUROLOGY CONSULTATION NOTE   Date of service: January 31, 2021 Patient Name: Tracy Keller MRN:  [id number] DOB:  May 23, 1996 Reason for consult: "headache concern for IIH" Requesting Provider: Hillary Bow, DO _ _ _   _ __   _ __ _ _  __ __   _ __   __ _  History of Present Illness  Tracy Keller is a 25 y.o. [redacted] weeks pregnant female with PMH significant for morbid obesity who presents with headache, double vision and R sided numbness.  Headache started on 01/22/21 with some R sided numbness, describes it as a bifrontal headcahe radiating to the occipital with throbbing and swooshing in her ear. Headache improves with sleep but she does wake up with black spots in her R eye. She also has nausea and vomiting but attributes this to her pregnancy. She endorses photphobia and phonophobia. Headche is worsened with cough, sneeze and with going upstrairs or other activity. She also reports diplopia. 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 She was sent to Neurology office and sent to the ED by Neurology for evaluation for IIH.  MRI Brain and MRV in the ED without contrast with no significant abnormality, MRV without contrast with no sinus venous thrombosis. However, notable for focal narrowing at the junction of the transverse and sigmoid sinuses bilaterally, possibly arachnoid granulations. MRV with contrast or CT venogram is recommended. LP in the ED was attempted but unable to obtain and plan on LP under fluoro.    ROS   Constitutional Denies weight loss, fever and chills.   HEENT Still has blurred vision. No problems with hearing.   Respiratory Denies SOB and cough.   CV Denies palpitations and CP   GI Denies abdominal pain, endorses nausea, vomiting but no diarrhea.   GU Denies dysuria and urinary frequency.   MSK Denies myalgia and joint pain.   Skin Denies rash and pruritus.   Neurological Endorses headache but no syncope.    Psychiatric Denies recent changes in mood. Denies anxiety and depression.    Past History   Past Medical History:  Diagnosis Date  . BMI 35.0-35.9,adult   . Medical history non-contributory    Past Surgical History:  Procedure Laterality Date  . WISDOM TOOTH EXTRACTION     Family History  Problem Relation Age of Onset  . Pseudotumor cerebri Neg Hx    Social History   Socioeconomic History  . Marital status: Single    Spouse name: Not on file  . Number of children: Not on file  . Years of education: Not on file  . Highest education level: Not on file  Occupational History  . Not on file  Tobacco Use  . Smoking status: Never  . Smokeless tobacco: Never  Substance and Sexual Activity  . Alcohol use: Never  . Drug use: Never  . Sexual activity: Yes  Other Topics Concern  . Not on file  Social History Narrative   Right handed   Coffee/iced tea sometimes   Lives with boyfriend   Social Determinants of Health   Financial Resource Strain: Not on file  Food Insecurity: Not on file  Transportation Needs: Not on file  Physical Activity: Not on file  Stress: Not on file  Social Connections: Not on file   Allergies  Allergen Reactions  . Other     seasonal allergies    Medications  (Not in a hospital admission)  Vitals   Vitals:   01/30/21 2133 01/31/21 0030 01/31/21 0139 01/31/21 0345  BP: 114/74 (!) 118/95 (!) 100/53 120/63  Pulse: 89 98 94 95  Resp: 18 17 18 16   Temp:    98.6 F (37 C)  TempSrc:    Oral  SpO2: 100% 98% 100% 100%  Weight:      Height:         Body mass index is 39.4 kg/m.  Physical Exam   General: Laying comfortably in bed; in no acute distress.  HENT: Normal oropharynx and mucosa. Normal external appearance of ears and nose.  Neck: Supple, no pain or tenderness  CV: No JVD. No peripheral edema.  Pulmonary: Symmetric Chest rise. Normal respiratory effort.  Abdomen: Soft to touch, non-tender.  Ext: No cyanosis, edema, or  deformity  Skin: No rash. Normal palpation of skin.   Musculoskeletal: Normal digits and nails by inspection. No clubbing.   Neurologic Examination  Mental status/Cognition: Alert, oriented to self, place, month and year, good attention.  Speech/language: Fluent, comprehension intact, object naming intact, repetition intact.  Cranial nerves:   CN II Pupils equal and reactive to light, no VF deficits    CN III,IV,VI EOM intact, no gaze preference or deviation, no nystagmus    CN V normal sensation in V1, V2, and V3 segments bilaterally    CN VII no asymmetry, no nasolabial fold flattening    CN VIII normal hearing to speech    CN IX & X normal palatal elevation, no uvular deviation    CN XI 5/5 head turn and 5/5 shoulder shrug bilaterally    CN XII midline tongue protrusion    Motor:  Muscle bulk: normal, tone normal, pronator drift none tremor none Mvmt Root Nerve  Muscle Right Left Comments  SA C5/6 Ax Deltoid 5 5   EF C5/6 Mc Biceps 5 5   EE C6/7/8 Rad Triceps 5 5   WF C6/7 Med FCR     WE C7/8 PIN ECU     F Ab C8/T1 U ADM/FDI 5 5   HF L1/2/3 Fem Illopsoas 5 5   KE L2/3/4 Fem Quad 5 5   DF L4/5 D Peron Tib Ant 5 5   PF S1/2 Tibial Grc/Sol 5 5    Reflexes:  Right Left Comments  Pectoralis      Biceps (C5/6) 2 2   Brachioradialis (C5/6) 3 3    Triceps (C6/7) 2 2    Patellar (L3/4) 3 3    Achilles (S1) 4 4 BL ankle clonus   Hoffman      Plantar down down   Jaw jerk    Sensation:  Light touch Intact throughout   Pin prick    Temperature    Vibration   Proprioception    Coordination/Complex Motor:  - Finger to Nose intact BL - Heel to shin intact BL - Rapid alternating movement are normal - Gait: Deferred.  Labs   CBC:  Recent Labs  Lab 01/30/21 1610  WBC 12.3*  NEUTROABS 9.3*  HGB 12.2  HCT 39.6  MCV 82.8  PLT 284    Basic Metabolic Panel:  Lab Results  Component Value Date   NA 136 01/30/2021   K 3.7 01/30/2021   CO2 23 01/30/2021   GLUCOSE  103 (H) 01/30/2021   BUN 5 (L) 01/30/2021   CREATININE 0.63 01/30/2021   CALCIUM 9.6 01/30/2021   GFRNONAA >60 01/30/2021   Lipid Panel: No results found for: LDLCALC HgbA1c:  No results found for: HGBA1C Urine Drug Screen: No results found for: LABOPIA, COCAINSCRNUR, LABBENZ, AMPHETMU, THCU, LABBARB  Alcohol Level No results found for: ETH  MR Venogram w/o contrast(personally reviewed): Focal narrowing at the junction of the transverse and sigmoid sinuses bilaterally, possibly arachnoid granulations. MRV with contrast or CT venogram is recommended.  MRI Brain(personally reviewed): No acute abnormality.  Impression   Tracy Keller is a 25 y.o. female with PMH significant for morbid obesity who presents with headache, double vision and R sided numbness. Headache are concerning for IIH specially with BL papilledema which is not seen with migraines. No meningismus on exam , no leuocytosis, low concern for meningitis.  Agree with LP under fluoro. I think we should hold off on MR Venogram with contrast for now given that she is pregnant. There is no obvious sinus venous thrombosis. Will seek clearance from Ob team potentially after the LP.   Recommendations  - Agree with LP under fluoro with opening pressure and basic studies including cell count, differential, protein, glucose, gram stain, CSF cultures and cryptococcus Ag. - Given the teratogenic potential of both Acetazolamide and Topiramate, may need serial Lps. - neurology will continue to follow along. __________________________________________________________________  Plan discussed with patient and with Dr. Julian Reil  Thank you for the opportunity to take part in the care of this patient. If you have any further questions, please contact the neurology consultation attending.  Signed,  Erick Blinks [facility] Pager Number [phone] _ _ _   _ __   _ __ _ _  __ __   _ __   __ _

## 2021-01-31 NOTE — Progress Notes (Signed)
I have seen and assessed patient and agree with Dr. Boston Service assessment and plan.  Patient is a 25 year old [redacted] weeks pregnant presenting with headache, numbness and tingling upper extremity, seen optometrist office with concerns for bilateral papilledema and sent to neurologist office and sent to the ED for evaluation of idiopathic intracranial hypertension.  Patient seen by neurology MRI brain MRV done with no significant abnormality.  LP attempted in the ED but unable to be obtained and LP ordered under fluoroscopy.  Neurology following.   No charge.

## 2021-02-01 DIAGNOSIS — Z3A18 18 weeks gestation of pregnancy: Secondary | ICD-10-CM

## 2021-02-01 DIAGNOSIS — G932 Benign intracranial hypertension: Secondary | ICD-10-CM | POA: Diagnosis present

## 2021-02-01 LAB — BASIC METABOLIC PANEL
Anion gap: 6 (ref 5–15)
BUN: 7 mg/dL (ref 6–20)
CO2: 24 mmol/L (ref 22–32)
Calcium: 9.3 mg/dL (ref 8.9–10.3)
Chloride: 106 mmol/L (ref 98–111)
Creatinine, Ser: 0.57 mg/dL (ref 0.44–1.00)
GFR, Estimated: >60 mL/min (ref >60)
Glucose, Bld: 96 mg/dL (ref 70–99)
Potassium: 3.8 mmol/L (ref 3.5–5.1)
Sodium: 136 mmol/L (ref 135–145)

## 2021-02-01 LAB — CBC
HCT: 33.4 % — ABNORMAL LOW (ref 36.0–46.0)
Hemoglobin: 10.4 g/dL — ABNORMAL LOW (ref 12.0–15.0)
MCH: 25.7 pg — ABNORMAL LOW (ref 26.0–34.0)
MCHC: 31.1 g/dL (ref 30.0–36.0)
MCV: 82.5 fL (ref 80.0–100.0)
Platelets: 238 10*3/uL (ref 150–400)
RBC: 4.05 MIL/uL (ref 3.87–5.11)
RDW: 15.4 % (ref 11.5–15.5)
WBC: 12.4 10*3/uL — ABNORMAL HIGH (ref 4.0–10.5)
nRBC: 0 % (ref 0.0–0.2)

## 2021-02-01 LAB — MAGNESIUM: Magnesium: 1.9 mg/dL (ref 1.7–2.4)

## 2021-02-01 MED ORDER — SODIUM CHLORIDE 0.9 % IV BOLUS
250.0000 mL | Freq: Once | INTRAVENOUS | Status: AC
  Administered 2021-02-01: 250 mL via INTRAVENOUS

## 2021-02-01 MED ORDER — SODIUM CHLORIDE 0.9 % IV BOLUS: 500.0000 mL | Freq: Once | INTRAVENOUS | Status: DC

## 2021-02-01 NOTE — Progress Notes (Signed)
PIV removed. AVS reviewed with patient. Patient verbalizes understanding of necessary follow up appointments. Patient provided with note for school per neurologist. Ready for discharge.

## 2021-02-01 NOTE — Progress Notes (Signed)
Neurology Progress Note  Patient ID: 25 yo woman [redacted] wks pregnant p/w bilateral papilledema, headaches and diplopia c/f IIH.  Interval events: - Pt underwent LP today that showed elevated OP of 41cm. After LP, her sx incl headaches and diplopia resolved.  - Seen by ophtho who recommended no further tx at this time, close o/p f/u within 1-2 weeks - Pt discussed with Dr. Mora Appl on call for OB, who felt that the teratogenic potential of acetazolamide after 20 weeks was extremely small since the fetus will be fully developed therefore she would be OK with starting it if patient's visual sx recurred.  O:  Vitals:   02/01/21 0838 02/01/21 1532  BP: (!) 98/47 128/61  Pulse: 84 92  Resp: 18 18  Temp: 98.7 F (37.1 C) 98.7 F (37.1 C)  SpO2: 99% 98%    Physical Exam Gen: A&Ox4, NAD HEENT: Atraumatic, normocephalic; oropharynx clear, tongue without atrophy or fasciculations. Resp: CTAB, normal work of breathing CV: RRR, extremities appear well-perfused. Abd: soft/NT/ND Extrem: Nml bulk; no cyanosis, clubbing, or edema.  Neuro: *MS: A&O x4. Follows multi-step commands.  *Speech: no dysarthria or aphasia, able to name and repeat. *CN:    I: Deferred   II,III: PERRLA, VFF by confrontation   III,IV,VI: EOMI w/o nystagmus, no ptosis   V: Sensation intact from V1 to V3 to LT   VII: Eyelid closure was full.  Smile symmetric.   VIII: Hearing intact to voice   IX,X: Voice normal, palate elevates symmetrically    XI: SCM/trap 5/5 bilat   XII: Tongue protrudes midline, no atrophy or fasciculations  *Motor:   Normal bulk.  No tremor, rigidity or bradykinesia. No pronator drift.   Strength: Dlt Bic Tri WE WrF FgS Gr HF KnF KnE PlF DoF    Left 5 5 5 5 5 5 5 5 5 5 5 5     Right 5 5 5 5 5 5 5 5 5 5 5 5    *Sensory: Intact to light touch, pinprick, temperature vibration throughout. Symmetric. Propioception intact bilat.  No double-simultaneous extinction.  *Coordination:  Finger-to-nose,  heel-to-shin, rapid alternating motions were intact. *Reflexes:  2+ and symmetric throughout without clonus; toes down-going bilat *Gait: deferred   Data  MRI brain wo contrast 01/30/21: WNL  MRV wo contrast 01/30/21: Focal narrowing at the junction of the transverse and sigmoid sinuses bilaterally, possibly arachnoid granulations. On personal review suspicion for acute DVST is low.  A/P: 25 yo woman [redacted] wks pregnant p/w bilateral papilledema, headaches and diplopia c/f IIH. Dx confirmed by elevated OP 41 on LP; after LP sx incl headache and diplopia have resolved. MRI brain wo contrast WNL. MRV not concerning for acute DVST.  Recommendations: - OK to discharge patient from hospital today on no new medications - Close outpatient f/u with established OB, ophtho, neurology providers. Patient will call offices to ensure she is seen by each within next 1-2 wks - Pt discussed with Dr. 04/01/21 on call for OB, who felt that the teratogenic potential of acetazolamide after 20 weeks was extremely small since the fetus will be fully developed therefore she would be OK with starting it if patient's visual sx recurred in the future  21, MD [facility] [phone]  If 7pm- 7am, please page neurology on call as listed in AMION.

## 2021-02-01 NOTE — Discharge Summary (Signed)
Physician Discharge Summary  Tracy Keller AYT:[id number] DOB: [birth year] DOA: 01/30/2021  PCP: Pcp, No  Admit date: 01/30/2021 Discharge date: 02/01/2021  Time spent: 50 minutes  Recommendations for Outpatient Follow-up:  Follow-up with Dr. Genia Del, ophthalmology in 1 to 2 weeks. Follow-up with OB/GYN as previously scheduled. Follow-up with primary neurologist in 1 to 2 weeks.   Discharge Diagnoses:  Principal Problem:   Pseudotumor cerebri syndrome Active Problems:   Headache   Discharge Condition: Stable and improved  Diet recommendation: Regular  Filed Weights   01/30/21 1228 01/30/21 1315  Weight: 100.7 kg 100.9 kg    History of present illness:  HPI per Dr. Karl Pock is a 25 y.o. female with medical history significant of [redacted] weeks pregnant.  Pt developed headache around the 25th of Aug.  Then developed numbness and tingling of R side on the 29th.   Pt seen for blurry vision on the 30th.  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  Patient reports that she had a similar episode of headaches last year, which was associated with left-sided numbness at that time.  She states that her symptoms had subsequently resolved without intervention.   She saw neurology clinic earlier this morning for symptoms who promptly sent her in to the ED due to concern for possible IIH with visual changes.   No fevers, chills.     ED Course: MRI brain and MRV w/o contrast were neg for acute findings.  Did have "Focal narrowing at the junction of the transverse and sigmoid sinuses bilaterally, possibly arachnoid granulations. MRV with contrast or CT venogram is recommended."   LP attempted by EDP but unsuccessful.   Hospitalist asked to admit for IR to do LP later this AM.   Hospital Course:  #1 pseudotumor cerebri/headache, papilledema, visual changes -Patient had presented with headache, papilledema, visual  changes. -MRI/MRV brain was done reviewed by neurologist who did not feel MRV with contrast was necessary at this time and was not consistent with a large sized venous thrombus. -Patient seen in consultation by neurology and was felt patient likely had IIH and recommended LP under fluoroscopy with opening pressure and basic studies including cell count, differential, protein, glucose, gram stain, CSF cultures and cryptococcus antigen obtained. -Lumbar puncture initially attempted per ED however unsuccessful. -It was thought per neurology that due to the teratogenic potential of both acetazolamide and topiramate patient may require serial LPs. -Patient underwent LP under fluoroscopy on 01/31/2021 with a elevated opening pressure of 41 cm. -Patient improved clinically post lumbar puncture with resolution of visual changes. -Neurology had recommended ophthalmology evaluation and patient seen in consultation by Dr. Genia Del, ophthalmology on 02/01/2021 -Ophthalmology recommended outpatient follow-up in 1 to 2 weeks and discuss serial LPs q. 10 to 14 days for symptomatic, relief during patient's pregnancy. -Neurology also discussed case with patient's primary OB/GYN. -Patient improved clinically and patient be discharged home in stable and improved condition with close outpatient follow-up with neurology, OB/GYN, ophthalmology.  2.  18 weeks pregnancy -Remained stable. -Outpatient follow-up with OB/GYN.  Procedures: MRI brain/MRV head without contrast 01/30/2021 Lumbar puncture under fluoroscopy per Dr. Purcell Mouton 01/31/2021   Consultations: Neurology: Dr.Khaliqdina 01/31/2021 Ophthalmology: Dr. Genia Del 02/01/2021  Discharge Exam: Vitals:   02/01/21 0838 02/01/21 1532  BP: (!) 98/47 128/61  Pulse: 84 92  Resp: 18 18  Temp: 98.7 F (37.1 C) 98.7 F (37.1 C)  SpO2: 99% 98%    General: NAD Cardiovascular:  RRR Respiratory: CTA B  Discharge Instructions   Discharge Instructions     Diet general    Complete by: As directed    Discharge wound care:   Complete by: As directed    As above   Increase activity slowly   Complete by: As directed       Allergies as of 02/01/2021       Reactions   Other    seasonal allergies        Medication List     TAKE these medications    acetaminophen 500 MG tablet Commonly known as: TYLENOL Take 1,000 mg by mouth every 6 (six) hours as needed for moderate pain or headache.   ondansetron 4 MG disintegrating tablet Commonly known as: Zofran ODT Take 1 tablet (4 mg total) by mouth every 6 (six) hours as needed for nausea.   PRENATAL ADULT GUMMY/DHA/FA PO Take 2 tablets by mouth at bedtime.               Discharge Care Instructions  (From admission, onward)           Start     Ordered   02/01/21 0000  Discharge wound care:       Comments: As above   02/01/21 1516           Allergies  Allergen Reactions   Other     seasonal allergies    Follow-up Information     Mincey, Daisy Blossom, MD. Schedule an appointment as soon as possible for a visit in 1 week(s).   Specialty: Ophthalmology Why: f/u in 1-2 weeks Contact information: [address] [place] [address] [phone]         OBGYN Follow up.   Why: f/u as scheduled or in 1 week        neurologist. Schedule an appointment as soon as possible for a visit in 2 week(s).   Why: f/u in 1-2 weeks                 The results of significant diagnostics from this hospitalization (including imaging, microbiology, ancillary and laboratory) are listed below for reference.    Significant Diagnostic Studies: MR BRAIN WO CONTRAST  Result Date: 01/30/2021 CLINICAL DATA:  Headache and papilledema EXAM: MRI HEAD WITHOUT CONTRAST MRV HEAD WITHOUT CONTRAST TECHNIQUE: Multiplanar, multi-echo pulse sequences of the brain and surrounding structures were acquired without intravenous contrast. Angiographic images of the intracranial venous  structures were acquired using MRV technique without intravenous contrast. COMPARISON:  No pertinent prior exam. FINDINGS: MRI HEAD WITHOUT CONTRAST Brain: No acute infarct, mass effect or extra-axial collection. No acute or chronic hemorrhage. Normal white matter signal, parenchymal volume and CSF spaces. The midline structures are normal. Vascular: Major flow voids are preserved. Skull and upper cervical spine: Normal calvarium and skull base. Visualized upper cervical spine and soft tissues are normal. Sinuses/Orbits:No paranasal sinus fluid levels or advanced mucosal thickening. No mastoid or middle ear effusion. Normal orbits. MR VENOGRAM WITHOUT CONTRAST Superior sagittal sinus: Normal. Straight sinus: Normal. Inferior sagittal sinus, vein of Galen and internal cerebral veins: Normal. Transverse sinuses: Focal narrowing bilaterally at the junction the transverse and sigmoid sinuses Sigmoid sinuses: Normal. Visualized jugular veins: Normal. IMPRESSION: 1. Normal MRI of the brain. 2. 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 MRV with contrast or CT venogram is recommended. Electronically Signed   By: Deatra Robinson M.D.   On: 01/30/2021 21:23  MR MRV HEAD WO CM  Result Date: 01/30/2021 CLINICAL DATA:  Headache and papilledema EXAM: MRI HEAD WITHOUT CONTRAST MRV HEAD WITHOUT CONTRAST TECHNIQUE: Multiplanar, multi-echo pulse sequences of the brain and surrounding structures were acquired without intravenous contrast. Angiographic images of the intracranial venous structures were acquired using MRV technique without intravenous contrast. COMPARISON:  No pertinent prior exam. FINDINGS: MRI HEAD WITHOUT CONTRAST Brain: No acute infarct, mass effect or extra-axial collection. No acute or chronic hemorrhage. Normal white matter signal, parenchymal volume and CSF spaces. The midline structures are normal. Vascular: Major flow voids are  preserved. Skull and upper cervical spine: Normal calvarium and skull base. Visualized upper cervical spine and soft tissues are normal. Sinuses/Orbits:No paranasal sinus fluid levels or advanced mucosal thickening. No mastoid or middle ear effusion. Normal orbits. MR VENOGRAM WITHOUT CONTRAST Superior sagittal sinus: Normal. Straight sinus: Normal. Inferior sagittal sinus, vein of Galen and internal cerebral veins: Normal. Transverse sinuses: Focal narrowing bilaterally at the junction the transverse and sigmoid sinuses Sigmoid sinuses: Normal. Visualized jugular veins: Normal. IMPRESSION: 1. Normal MRI of the brain. 2. 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 MRV with contrast or CT venogram is recommended. Electronically Signed   By: Deatra Robinson M.D.   On: 01/30/2021 21:23   DG Lumbar Puncture Fluoro Guide  Result Date: 01/31/2021 CLINICAL DATA:  Headaches with papilledema and double vision. Nineteen weeks pregnant. EXAM: DIAGNOSTIC LUMBAR PUNCTURE UNDER FLUOROSCOPIC GUIDANCE COMPARISON:  Cranial MRI and MRV 01/30/2021. FLUOROSCOPY TIME:  Fluoroscopy Time: 12 seconds of low-dose pulsed fluoroscopy Radiation Exposure Index (if provided by the fluoroscopic device): 2.7 mGy Number of Acquired Spot Images: 0 PROCEDURE: Standard time-out was employed. I discussed the risks (including hemorrhage, infection, headache, and nerve damage, among others), benefits, and alternatives to fluoroscopically guided lumbar puncture with the patient. We specifically discussed the high technical likelihood of success of the procedure and the low risks of low-dose radiation in the 2nd trimester of pregnancy. The patient understood and elected to undergo the procedure. An appropriate site for lumbar puncture was marked on the patient's skin under fluoroscopic guidance. Following sterile skin prep and local anesthetic administration consisting of 1 percent  lidocaine, a 22 gauge spinal needle was advanced without difficulty into the thecal sac at the at the L2-3 level under fluoroscopic guidance. Clear CSF was returned. Opening pressure was elevated at 41 cm water, measured prone through the needle. 23 cc of clear CSF was collected. At that point, the pressure no longer seemed elevated, and there was limited CSF return. The needle was subsequently removed and the skin cleansed and bandaged. No immediate complications were observed. IMPRESSION: 1. Diagnostic lumbar puncture performed without complication. CSF samples sent to the laboratory. 2. Elevated opening pressure. Electronically Signed   By: Carey Bullocks M.D.   On: 01/31/2021 11:59    Microbiology: Recent Results (from the past 240 hour(s))  Resp Panel by RT-PCR (Flu A&B, Covid) Nasopharyngeal Swab     Status: None   Collection Time: 01/30/21  9:50 PM   Specimen: Nasopharyngeal Swab; Nasopharyngeal(NP) swabs in vial transport medium  Result Value Ref Range Status   SARS Coronavirus 2 by RT PCR NEGATIVE NEGATIVE Final    Comment: (NOTE) SARS-CoV-2 target nucleic acids are NOT DETECTED.  The SARS-CoV-2 RNA is generally detectable in upper respiratory specimens during the acute phase of infection. The lowest concentration of SARS-CoV-2 viral copies this assay can detect is 138 copies/mL. A negative result does  not preclude SARS-Cov-2 infection and should not be used as the sole basis for treatment or other patient management decisions. A negative result may occur with  improper specimen collection/handling, submission of specimen other than nasopharyngeal swab, presence of viral mutation(s) within the areas targeted by this assay, and inadequate number of viral copies(<138 copies/mL). A negative result must be combined with clinical observations, patient history, and epidemiological information. The expected result is Negative.  Fact Sheet for Patients:   BloggerCourse.comhttps://www.fda.gov/media/152166/download  Fact Sheet for Healthcare Providers:  SeriousBroker.ithttps://www.fda.gov/media/152162/download  This test is no t yet approved or cleared by the [place] FDA and  has been authorized for detection and/or diagnosis of SARS-CoV-2 by FDA under an Emergency Use Authorization (EUA). This EUA will remain  in effect (meaning this test can be used) for the duration of the COVID-19 declaration under Section 564(b)(1) of the Act, 21 U.S.C.section 360bbb-3(b)(1), unless the authorization is terminated  or revoked sooner.       Influenza A by PCR NEGATIVE NEGATIVE Final   Influenza B by PCR NEGATIVE NEGATIVE Final    Comment: (NOTE) The Xpert Xpress SARS-CoV-2/FLU/RSV plus assay is intended as an aid in the diagnosis of influenza from Nasopharyngeal swab specimens and should not be used as a sole basis for treatment. Nasal washings and aspirates are unacceptable for Xpert Xpress SARS-CoV-2/FLU/RSV testing.  Fact Sheet for Patients: BloggerCourse.comhttps://www.fda.gov/media/152166/download  Fact Sheet for Healthcare Providers: SeriousBroker.ithttps://www.fda.gov/media/152162/download  This test is not yet approved or cleared by the [place] FDA and has been authorized for detection and/or diagnosis of SARS-CoV-2 by FDA under an Emergency Use Authorization (EUA). This EUA will remain in effect (meaning this test can be used) for the duration of the COVID-19 declaration under Section 564(b)(1) of the Act, 21 U.S.C. section 360bbb-3(b)(1), unless the authorization is terminated or revoked.  Performed at [facility], [address] [place], [place] [address]   CSF culture w Gram Stain     Status: None (Preliminary result)   Collection Time: 01/31/21 11:46 AM   Specimen: PATH Cytology CSF; Cerebrospinal Fluid  Result Value Ref Range Status   Specimen Description CSF  Final   Special Requests NONE  Final   Gram Stain   Final    WBC PRESENT,BOTH PMN AND MONONUCLEAR NO  ORGANISMS SEEN CYTOSPIN SMEAR    Culture   Final    NO GROWTH < 24 HOURS Performed at [facility], [address] [place], [place] [address]    Report Status PENDING  Incomplete     Labs: Basic Metabolic Panel: Recent Labs  Lab 01/30/21 1610 01/31/21 0813 02/01/21 0213  NA 136 137 136  K 3.7 3.9 3.8  CL 104 106 106  CO2 23 23 24   GLUCOSE 103* 93 96  BUN 5* 7 7  CREATININE 0.63 0.54 0.57  CALCIUM 9.6 9.2 9.3  MG  --  1.7 1.9   Liver Function Tests: Recent Labs  Lab 01/30/21 1610  AST 20  ALT 24  ALKPHOS 70  BILITOT 0.2*  PROT 7.1  ALBUMIN 3.4*   No results for input(s): LIPASE, AMYLASE in the last 168 hours. No results for input(s): AMMONIA in the last 168 hours. CBC: Recent Labs  Lab 01/30/21 1610 01/31/21 0813 02/01/21 0213  WBC 12.3* 12.3* 12.4*  NEUTROABS 9.3* 8.4*  --   HGB 12.2 10.6* 10.4*  HCT 39.6 34.4* 33.4*  MCV 82.8 83.7 82.5  PLT 284 250 238   Cardiac Enzymes: No results for  input(s): CKTOTAL, CKMB, CKMBINDEX, TROPONINI in the last 168 hours. BNP: BNP (last 3 results) No results for input(s): BNP in the last 8760 hours.  ProBNP (last 3 results) No results for input(s): PROBNP in the last 8760 hours.  CBG: No results for input(s): GLUCAP in the last 168 hours.     Signed:  Ramiro Harvest MD.  [facility] 02/01/2021, 3:59 PM

## 2021-02-01 NOTE — Consult Note (Signed)
Chief Complaint  Patient presents with   Headache   Diplopia  :      Ophthalmology HPI: This is a 25 y.o.  female  [redacted] weeks pregnant with a past ocular history of refractive error presents with diplopia and headaches in the setting of elevated intracranial pressure. Patient has had whooshing in her hears and frontal headaches. She has had 5 days of persistent diplopia that was relieved with covering either eye. Horizontal diplopia. Her diplopia has resolved since admission and spinal tap.  Currently no headaches.        Past Medical History:  Diagnosis Date   BMI 35.0-35.9,adult    Medical history non-contributory      Past Surgical History:  Procedure Laterality Date   WISDOM TOOTH EXTRACTION       Social History   Socioeconomic History   Marital status: Single    Spouse name: Not on file   Number of children: Not on file   Years of education: Not on file   Highest education level: Not on file  Occupational History   Not on file  Tobacco Use   Smoking status: Never   Smokeless tobacco: Never  Substance and Sexual Activity   Alcohol use: Never   Drug use: Never   Sexual activity: Yes  Other Topics Concern   Not on file  Social History Narrative   Right handed   Coffee/iced tea sometimes   Lives with boyfriend   Social Determinants of Health   Financial Resource Strain: Not on file  Food Insecurity: Not on file  Transportation Needs: Not on file  Physical Activity: Not on file  Stress: Not on file  Social Connections: Not on file  Intimate Partner Violence: Not on file     Allergies  Allergen Reactions   Other     seasonal allergies     No current facility-administered medications on file prior to encounter.   Current Outpatient Medications on File Prior to Encounter  Medication Sig Dispense Refill   acetaminophen (TYLENOL) 500 MG tablet Take 1,000 mg by mouth every 6 (six) hours as needed for moderate pain or headache.     ondansetron  (ZOFRAN ODT) 4 MG disintegrating tablet Take 1 tablet (4 mg total) by mouth every 6 (six) hours as needed for nausea. 20 tablet 0   Prenatal MV & Min w/FA-DHA (PRENATAL ADULT GUMMY/DHA/FA PO) Take 2 tablets by mouth at bedtime.       ROS    Exam:  General: Awake, Alert, Oriented *3  Vision (near): with correction   OD: 4 point   OS: 4 point  Confrontational Field:   Full to count fingers, both eyes  Extraocular Motility:  Full ductions and versions, both eyes  Maddox:             Ortho by maddox. No motility problem noted.   External:   Normal Symmetry, V1-3 intact, infraorbital nerve appears intact.     Pupils  OD: 1mm to 36mm reactive without afferent pupillary defect (APD)  OS: 93mm to 26mm reactive without afferent pupillary defect (APD)   IOP(tonopen)  OD: 16  OS: 16  Slit Lamp Exam:  Lids/Lashes  OD: Normal Lids and lashes, No lesion or injury  OS: Normal lids and lashes, nor lesion or injury  Conjucntiva/Sclera  OD: White and quiet  OS: White and quiet  Cornea  OD: Clear without abrasion or defect  OS: Clear without abrasion or defect  Anterior Chamber  OD: Deep  and quiet  OS: Deep and quiet  Iris  OD: Normal iris architecture  OS: Normal Iris Architecture   Lens  OD: Clear, Without significant opacities  OS: Clear, Without significant opacities  Anterior Vitreous  OD: Clear, without cell  OS: Clear without cell   POSTERIOR POLE EXAM (Dialated with phenylephrine and tropicamide.Dilation may last up to 24 hours)  View:   OD: 20/20 view without opacities  OS: 20/20 view without opacities  Vitreous:   OD: Clear, no cell  OS: Clear, no cell  Disc:   OD: Disc edema   OS: Disc edema   C:D Ratio:   OD: 0.1  OS: 0.1  Macula  OD: Flat, with appropriate light reflex  OS: Flat with appropriate light reflex  Vessels  OD: Normal vasculature  OS: Normal vasculature  Periphery  OD: Flat 360 degrees without tear, hole or  detachment  OS: Flat 360 degrees without tear, hole or detachment     Assessment and Plan:   This is 25 y.o.  female with IIH and associated headaches and diplopia in the setting of being [redacted] weeks pregnant. Lumbar puncture has temporarily alleviated symptoms.  Discussed IIH in pregnancy. Visual prognosis is good, but will need to follow.   - Recommend Outpatient follow up in 1-2 weeks.  - Recommend observation at this time.  - Discussed serial lumbar puncture q 10-14 days for symptomatic  relief during pregnancy. Patient stated that her symptoms were not significant enough to require LPs at this time   Mack Hook, M.D.  [facility] [address] [place], [place] [address] [phone]

## 2021-02-01 NOTE — Progress Notes (Signed)
To Whom It May Concern:  Please excuse [name] from school attendance and assignments from Sept 1-5, 2022 due to a medical condition that required hospitalization.  Thank you,  Bing Neighbors, MD [facility]

## 2021-02-03 LAB — CSF CULTURE W GRAM STAIN: Culture: NO GROWTH

## 2021-02-03 NOTE — Telephone Encounter (Signed)
Called patient to offer Oct 3rd FU. She stated she was told to FU with neurologist in 1-2 weeks per discharge MD. Per hospital D/c notes she is to be seen sooner that one month. We scheduled FU; patient verbalized understanding, appreciation.

## 2021-02-03 NOTE — Telephone Encounter (Signed)
LVM requesting call back to schedule one month FU. Advised she can be seen 03/02/21, either 1 pm or 3 pm.

## 2021-02-11 ENCOUNTER — Ambulatory Visit: Payer: BC Managed Care – PPO | Admitting: Diagnostic Neuroimaging

## 2021-02-11 ENCOUNTER — Encounter: Payer: Self-pay | Admitting: Diagnostic Neuroimaging

## 2021-02-11 VITALS — BP 126/74 | HR 117 | Ht 63.5 in | Wt 222.2 lb

## 2021-02-11 DIAGNOSIS — G932 Benign intracranial hypertension: Secondary | ICD-10-CM

## 2021-02-11 NOTE — Progress Notes (Signed)
[facility]  PATIENT: Tracy Keller DOB: 1996/04/24  REFERRING CLINICIAN: No ref. provider found HISTORY FROM: patient  REASON FOR VISIT: follow up   HISTORICAL  CHIEF COMPLAINT:  Chief Complaint  Patient presents with   Follow-up    Rm 7 alone here to f/u from ED visit on 9/2/2.2. Reports h/a are somewhat improved but not back to baseline. Denies double vision but feels like her eye sight is "weak". Reports the swooshing feeling in her left ear has returned as well.    HISTORY OF PRESENT ILLNESS:   UPDATE (02/11/21, VRP): Since last visit, doing patient went to emergency room and hospital for evaluation.  LP confirmed elevated opening pressure 41 cmH2O.  Symptoms gradually improved.  Currently patient still having some mild headaches but manageable.  Her vision problems have improved.   PRIOR HPI (01/30/21): 25 year old G1P0 female, currently [redacted] weeks pregnant, here for evaluation headaches, numbness, papilledema and vision problems.  Symptoms suddenly started on 01/22/21 with headaches. Then on 01/26/2021 had with right sided numbness and blurred vision.  She also noticed horizontal double vision with both eyes open.  In the morning when she wakes up she cannot see out of her right eye but this gradually improves.  She has been having global headaches as well.  Patient went to OB/GYN clinic for evaluation on 01/26/21, and was recommended to go to the emergency room, but when she got there the wait was too long and so she went home. She also went to optometrist on 01/28/2021, who detected papilledema on the right side more than the left.  Patient was also referred here urgently for evaluation.  Currently patient is having blurred vision, double vision, headaches.  No ringing in ears.  Last year patient had an episode of headaches associated with numbness on the left side.  EMS came to her home, but symptoms spontaneously resolved and she did not seek further medical  attention for this.   REVIEW OF SYSTEMS: Full 14 system review of systems performed and negative with exception of: As per HPI.  ALLERGIES: Allergies  Allergen Reactions   Other     seasonal allergies    HOME MEDICATIONS: Outpatient Medications Prior to Visit  Medication Sig Dispense Refill   acetaminophen (TYLENOL) 500 MG tablet Take 1,000 mg by mouth every 6 (six) hours as needed for moderate pain or headache.     ondansetron (ZOFRAN ODT) 4 MG disintegrating tablet Take 1 tablet (4 mg total) by mouth every 6 (six) hours as needed for nausea. 20 tablet 0   Prenatal MV & Min w/FA-DHA (PRENATAL ADULT GUMMY/DHA/FA PO) Take 2 tablets by mouth at bedtime.     No facility-administered medications prior to visit.    PAST MEDICAL HISTORY: Past Medical History:  Diagnosis Date   BMI 35.0-35.9,adult    Medical history non-contributory     PAST SURGICAL HISTORY: Past Surgical History:  Procedure Laterality Date   WISDOM TOOTH EXTRACTION      FAMILY HISTORY: Family History  Problem Relation Age of Onset   Pseudotumor cerebri Neg Hx     SOCIAL HISTORY: Social History   Socioeconomic History   Marital status: Single    Spouse name: Not on file   Number of children: Not on file   Years of education: Not on file   Highest education level: Not on file  Occupational History   Not on file  Tobacco Use   Smoking status: Never   Smokeless tobacco: Never  Substance  and Sexual Activity   Alcohol use: Never   Drug use: Never   Sexual activity: Yes  Other Topics Concern   Not on file  Social History Narrative   Right handed   Coffee/iced tea sometimes   Lives with boyfriend   Social Determinants of Health   Financial Resource Strain: Not on file  Food Insecurity: Not on file  Transportation Needs: Not on file  Physical Activity: Not on file  Stress: Not on file  Social Connections: Not on file  Intimate Partner Violence: Not on file     PHYSICAL EXAM  GENERAL  EXAM/CONSTITUTIONAL: Vitals:  Vitals:   02/11/21 1547  BP: 126/74  Pulse: (!) 117  SpO2: 98%  Weight: 222 lb 4 oz (100.8 kg)  Height: 5' 3.5" (1.613 m)   Body mass index is 38.75 kg/m. Wt Readings from Last 3 Encounters:  02/11/21 222 lb 4 oz (100.8 kg)  01/30/21 222 lb 6.4 oz (100.9 kg)  01/30/21 222 lb 9.6 oz (101 kg)   Patient is in no distress; well developed, nourished and groomed; neck is supple  CARDIOVASCULAR: Examination of carotid arteries is normal; no carotid bruits Regular rate and rhythm, no murmurs Examination of peripheral vascular system by observation and palpation is normal  EYES: No results found.   MUSCULOSKELETAL: Gait, strength, tone, movements noted in Neurologic exam below  NEUROLOGIC: MENTAL STATUS:  No flowsheet data found. awake, alert, oriented to person, place and time recent and remote memory intact normal attention and concentration language fluent, comprehension intact, naming intact fund of knowledge appropriate  CRANIAL NERVE:  2nd - MILD PAPILLEDEMA BILATERAL 2nd, 3rd, 4th, 6th - pupils equal and reactive to light, visual fields full to confrontation, extraocular muscles full; no nystagmus 5th - facial sensation symmetric 7th - facial strength symmetric 8th - hearing intact 9th - palate elevates symmetrically, uvula midline 11th - shoulder shrug symmetric 12th - tongue protrusion midline  MOTOR:  normal bulk and tone, full strength in the BUE, BLE  SENSORY:  normal and symmetric to light touch, temperature, vibration  COORDINATION:  finger-nose-finger, fine finger movements normal  REFLEXES:  deep tendon reflexes 1+ and symmetric  GAIT/STATION:  narrow based gait     DIAGNOSTIC DATA (LABS, IMAGING, TESTING) - I reviewed patient records, labs, notes, testing and imaging myself where available.  Lab Results  Component Value Date   WBC 12.4 (H) 02/01/2021   HGB 10.4 (L) 02/01/2021   HCT 33.4 (L) 02/01/2021    MCV 82.5 02/01/2021   PLT 238 02/01/2021      Component Value Date/Time   NA 136 02/01/2021 0213   K 3.8 02/01/2021 0213   CL 106 02/01/2021 0213   CO2 24 02/01/2021 0213   GLUCOSE 96 02/01/2021 0213   BUN 7 02/01/2021 0213   CREATININE 0.57 02/01/2021 0213   CALCIUM 9.3 02/01/2021 0213   PROT 7.1 01/30/2021 1610   ALBUMIN 3.4 (L) 01/30/2021 1610   AST 20 01/30/2021 1610   ALT 24 01/30/2021 1610   ALKPHOS 70 01/30/2021 1610   BILITOT 0.2 (L) 01/30/2021 1610   GFRNONAA >60 02/01/2021 0213   No results found for: CHOL, HDL, LDLCALC, LDLDIRECT, TRIG, CHOLHDL No results found for: IRJJ8A No results found for: VITAMINB12 No results found for: TSH  01/30/21 MRI brain / MRV head 1. Normal MRI of the brain. 2. 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 MRV with contrast or CT venogram is recommended.  01/31/21 LP via IR  PROCEDURE: Standard time-out was employed. I discussed the risks (including hemorrhage, infection, headache, and nerve damage, among others), benefits, and alternatives to fluoroscopically guided lumbar puncture with the patient. We specifically discussed the high technical likelihood of success of the procedure and the low risks of low-dose radiation in the 2nd trimester of pregnancy. The patient understood and elected to undergo the procedure.   An appropriate site for lumbar puncture was marked on the patient's skin under fluoroscopic guidance. Following sterile skin prep and local anesthetic administration consisting of 1 percent lidocaine, a 22 gauge spinal needle was advanced without difficulty into the thecal sac at the at the L2-3 level under fluoroscopic guidance. Clear CSF was returned. Opening pressure was elevated at 41 cm water, measured prone through the needle.   ASSESSMENT AND PLAN  25 y.o. year old female here with new onset of headaches, right-sided numbness,  papilledema, double vision, transient visual obscuration, since 01/26/2021.    Dx:  1. IIH (idiopathic intracranial hypertension)       PLAN:  idiopathic intracranial hypertension (pseudotumor cerebri) --> NEW ONSET HEADACHES, RIGHT SIDED NUMBNESS, PAPILLEDEMA, DOUBLE VISION, TRANSIENT VISUAL OBSCURATION (? Bilateral CN6 palsies) since 01/26/21  - follow up eye exam (Dr. Genia Del)  - if sxs sig worsen, then may consider optic nerve fenestration, VP shunt or meds (acetazolamide or topiramate); caution with pregnancy state  - may consider follow up LP in future   Return in about 3 months (around 05/13/2021).    Suanne Marker, MD 02/11/2021, 4:02 PM Certified in Neurology, Neurophysiology and Neuroimaging  [facility] [address] [place], [place] [address] [phone]

## 2021-02-11 NOTE — Patient Instructions (Signed)
-   follow up eye exam (Dr. Genia Del) - if sxs sig worsen, then may consider optic nerve fenestration, VP shunt or meds (acetazolamide or topiramate) - may consider follow up LP in future

## 2021-02-14 ENCOUNTER — Other Ambulatory Visit: Payer: Self-pay

## 2021-02-14 ENCOUNTER — Inpatient Hospital Stay (HOSPITAL_COMMUNITY)
Admission: AD | Admit: 2021-02-14 | Discharge: 2021-02-14 | Disposition: A | Discharge status: Home / Self Care | Payer: BC Managed Care – PPO | Attending: Obstetrics and Gynecology | Admitting: Obstetrics and Gynecology

## 2021-02-14 DIAGNOSIS — O99352 Diseases of the nervous system complicating pregnancy, second trimester: Secondary | ICD-10-CM

## 2021-02-14 DIAGNOSIS — Z3A20 20 weeks gestation of pregnancy: Secondary | ICD-10-CM

## 2021-02-14 DIAGNOSIS — H9202 Otalgia, left ear: Secondary | ICD-10-CM | POA: Insufficient documentation

## 2021-02-14 DIAGNOSIS — Z711 Person with feared health complaint in whom no diagnosis is made: Secondary | ICD-10-CM

## 2021-02-14 DIAGNOSIS — O99891 Other specified diseases and conditions complicating pregnancy: Secondary | ICD-10-CM | POA: Insufficient documentation

## 2021-02-14 DIAGNOSIS — G932 Benign intracranial hypertension: Secondary | ICD-10-CM

## 2021-02-14 MED ORDER — BLOOD PRESSURE CUFF MISC: 0 refills | Status: DC

## 2021-02-14 NOTE — MAU Note (Addendum)
Has a condition called Pseudo tumor "IIH" wants to make sure that blood pressure is not high because hearing whooshing sound in left ear mainly x 4 days.  No vaginal bleeding. Baby moving well. No leaking. Upper abd pain, gas pains on and off but went away for now and thinks it is constipation.

## 2021-02-14 NOTE — MAU Provider Note (Signed)
Event Date/Time   First Provider Initiated Contact with Patient 02/14/21 2255      S [name] is a 25 y.o. G1P0 patient who presents to MAU today with complaint of ear pain.  She states she has been experiencing a "whooshing sound" in her left ear that she thought may be related to her blood pressure.  She reports it has been occurring intermittently for about 4 days.  Patient states she has an eye doctor appt on Monday and a Neurology appt on Wednesday.    O BP 110/65 (BP Location: Right Arm)   Pulse (!) 108   Temp 98.4 F (36.9 C) (Oral)   Resp 16   Ht 5\' 3"  (1.6 m)   Wt 102.1 kg   LMP 09/21/2020   SpO2 98%   BMI 39.86 kg/m  Physical Exam Constitutional:      Appearance: Normal appearance.  HENT:     Head: Normocephalic and atraumatic.  Eyes:     Conjunctiva/sclera: Conjunctivae normal.  Cardiovascular:     Rate and Rhythm: Normal rate and regular rhythm.     Heart sounds: Normal heart sounds.  Pulmonary:     Effort: Pulmonary effort is normal.  Abdominal:     Palpations: Abdomen is soft.  Skin:    General: Skin is warm and dry.  Neurological:     Mental Status: She is alert and oriented to person, place, and time.  Psychiatric:        Mood and Affect: Mood normal.        Behavior: Behavior normal.        Thought Content: Thought content normal.    A Medical screening exam complete Pseudotumor Cerebri Syndrome [redacted] weeks pregnant Physically Well but Worried  P -Patient states that if blood pressure normal, she requires no further assessment. -Discussed usage of home blood pressure cuff. -Provider will send to [facility] for home usage. -Given information on how to correctly take blood pressure. -Given information for [facility] as patient stating she having a hard time coping with recent diagnosis. -Also encouraged to talk to primary office for additional information/resources.  -Instructed to keep appts as scheduled -Discharge from MAU in  stable condition -Warning signs for worsening condition that would warrant emergency follow-up discussed Patient may return to MAU as needed for pregnancy related complaints.   [facility], CNM 02/14/2021 10:55 PM

## 2021-02-20 ENCOUNTER — Other Ambulatory Visit: Payer: Self-pay | Admitting: Obstetrics and Gynecology

## 2021-02-20 DIAGNOSIS — Z363 Encounter for antenatal screening for malformations: Secondary | ICD-10-CM

## 2021-02-24 ENCOUNTER — Other Ambulatory Visit: Payer: Self-pay

## 2021-02-24 ENCOUNTER — Encounter: Payer: Self-pay | Admitting: *Deleted

## 2021-02-24 ENCOUNTER — Ambulatory Visit: Payer: BC Managed Care – PPO | Attending: Obstetrics and Gynecology

## 2021-02-24 ENCOUNTER — Ambulatory Visit (HOSPITAL_BASED_OUTPATIENT_CLINIC_OR_DEPARTMENT_OTHER): Payer: BC Managed Care – PPO | Admitting: Obstetrics and Gynecology

## 2021-02-24 ENCOUNTER — Other Ambulatory Visit: Payer: Self-pay | Admitting: *Deleted

## 2021-02-24 ENCOUNTER — Ambulatory Visit: Payer: BC Managed Care – PPO | Admitting: *Deleted

## 2021-02-24 VITALS — BP 115/68 | HR 107

## 2021-02-24 DIAGNOSIS — Z6841 Body Mass Index (BMI) 40.0 and over, adult: Secondary | ICD-10-CM

## 2021-02-24 DIAGNOSIS — Z3A22 22 weeks gestation of pregnancy: Secondary | ICD-10-CM

## 2021-02-24 DIAGNOSIS — O99212 Obesity complicating pregnancy, second trimester: Secondary | ICD-10-CM | POA: Insufficient documentation

## 2021-02-24 DIAGNOSIS — O4440 Low lying placenta NOS or without hemorrhage, unspecified trimester: Secondary | ICD-10-CM | POA: Diagnosis present

## 2021-02-24 DIAGNOSIS — G932 Benign intracranial hypertension: Secondary | ICD-10-CM

## 2021-02-24 DIAGNOSIS — Z363 Encounter for antenatal screening for malformations: Secondary | ICD-10-CM | POA: Insufficient documentation

## 2021-02-24 IMAGING — US US MFM OB DETAIL+14 WK
1 series · 14 of 28 positions shown · non-contrast
Comparison: none

[Series 1: us mfm ob detail+14 wk · 108 acquisitions, 14 frames shown]
[im 4/108]
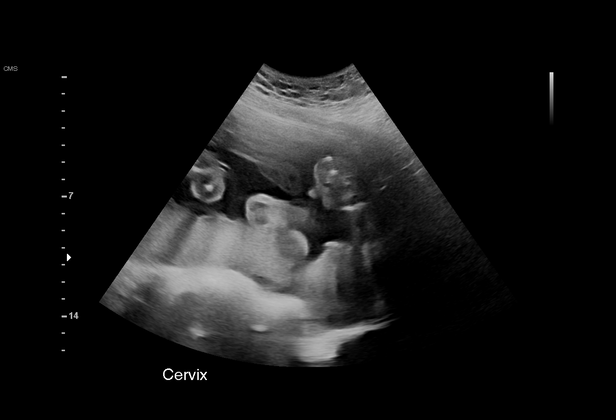
[im 12/108]
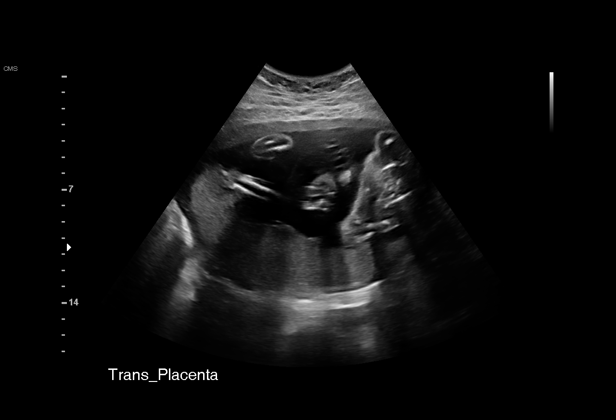
[im 20/108]
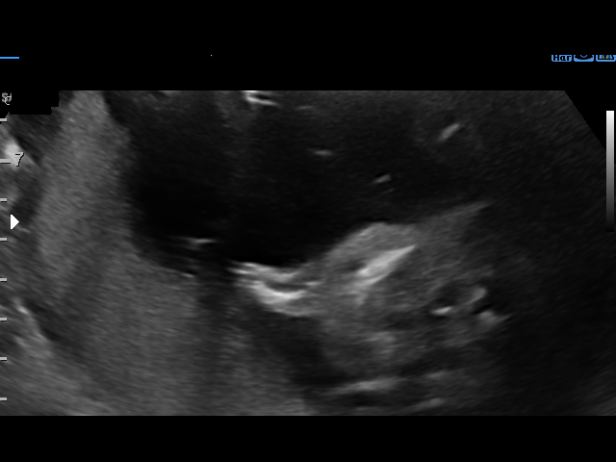
[im 28/108]
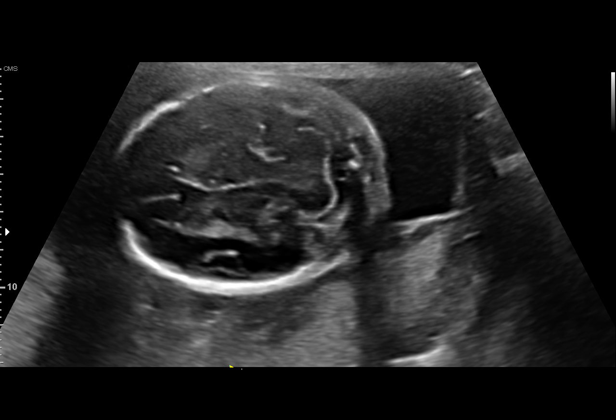
[im 36/108]
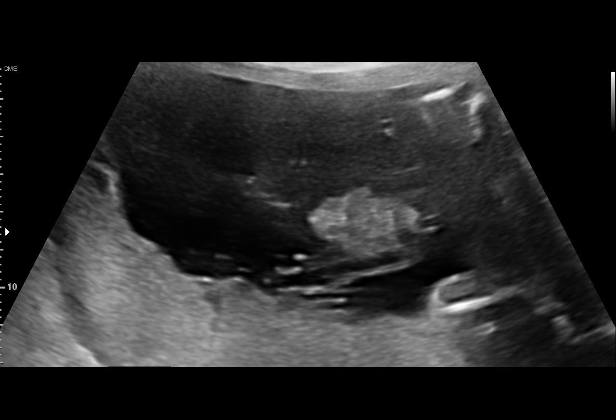
[im 44/108]
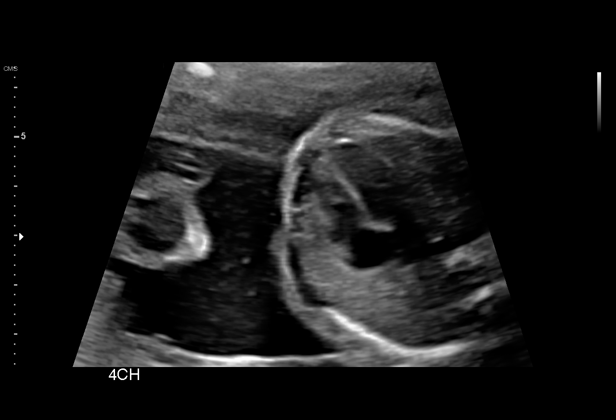
[im 52/108]
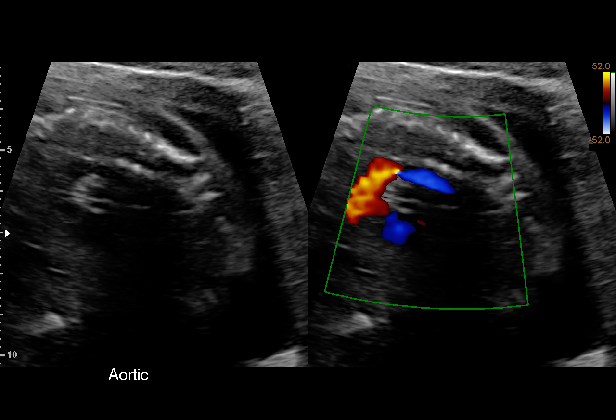
[im 60/108]
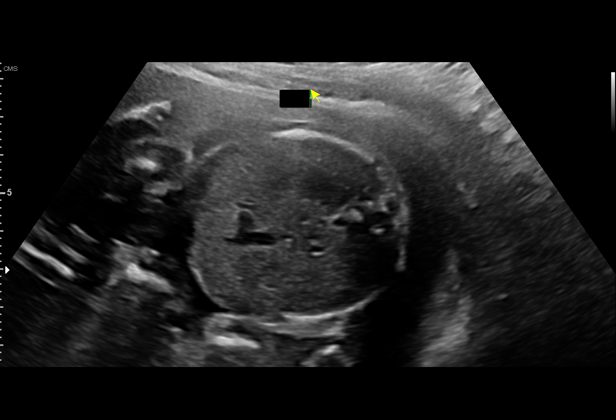
[im 68/108]
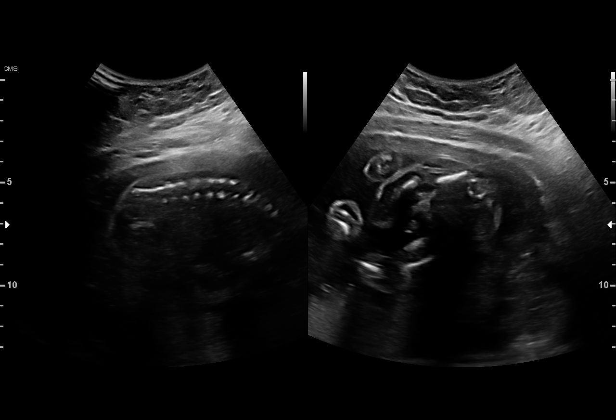
[im 76/108]
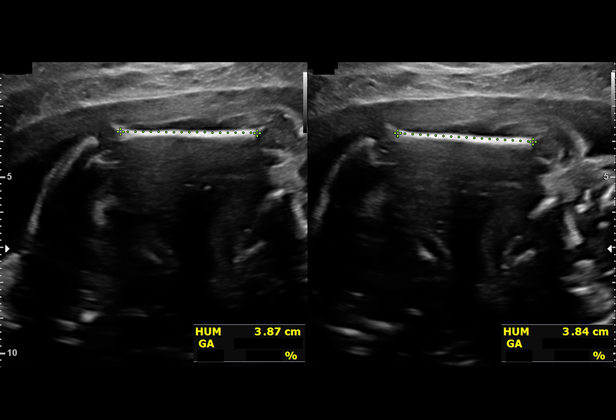
[im 84/108]
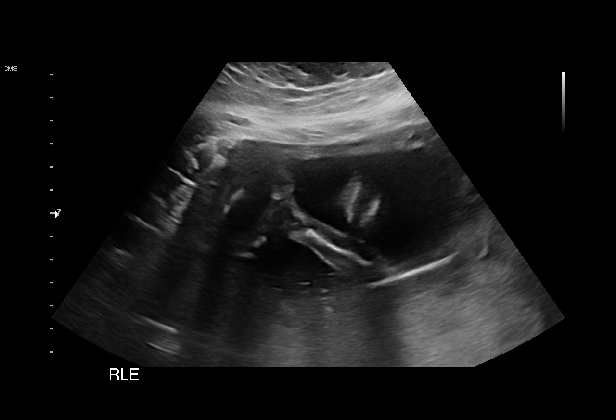
[im 92/108]
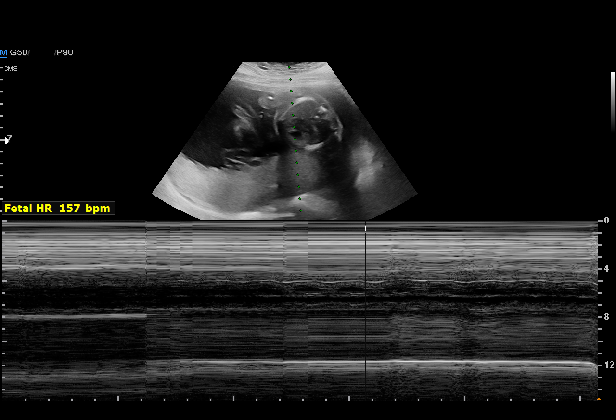
[im 100/108]
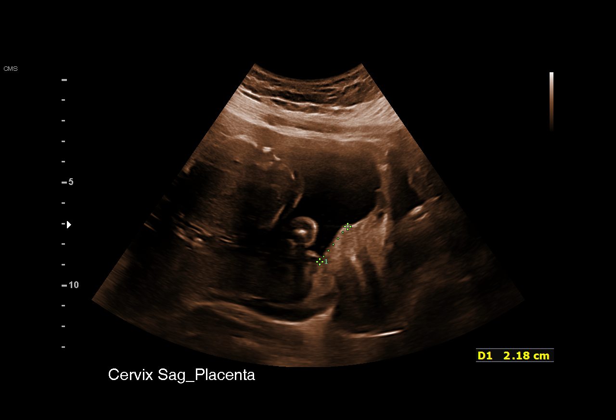
[im 108/108]
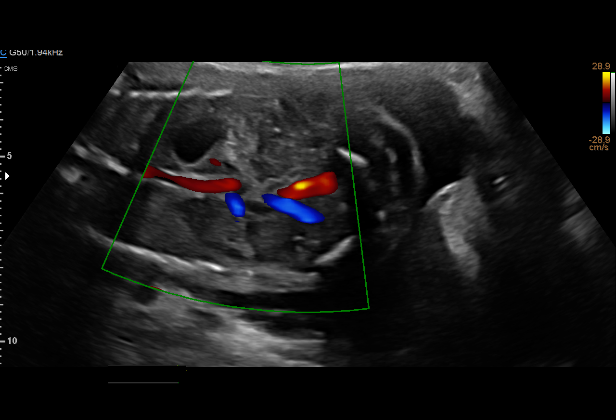

[14 of 28 positions shown; findings below may reference images not displayed]

Obstetrics &
                                                            Gynecology
                                                            [Y7] NEYMAN
                                                            NEYMAN.
                   CNM

Indications

 Medical complication of pregnancy (IIH -       [Y7]
 new dx)
 Obesity complicating pregnancy, second         [Y7]
 trimester (pregravid BMI 40)
 22 weeks gestation of pregnancy
 Antenatal screening for malformations          [Y7]
 Low lying placenta, antepartum                 [Y7]
Fetal Evaluation

 Num Of Fetuses:         1
 Fetal Heart Rate(bpm):  157
 Cardiac Activity:       Observed
 Presentation:           Variable
 Placenta:               Posterior, low-lying, 1.4 cm from int os
 P. Cord Insertion:      Visualized

 Amniotic Fluid
 AFI FV:      Within normal limits

                             Largest Pocket(cm)

Biometry

 BPD:      53.6  mm     G. Age:  22w 2d         47  %    CI:        68.56   %    70 - 86
                                                         FL/HC:      18.8   %    18.4 -
 HC:      206.9  mm     G. Age:  22w 6d         59  %    HC/AC:      1.15        1.06 -
 AC:      179.3  mm     G. Age:  22w 6d         59  %    FL/BPD:     72.4   %    71 - 87
 FL:       38.8  mm     G. Age:  22w 3d         45  %    FL/AC:      21.6   %    20 - 24
 HUM:      38.7  mm     G. Age:  23w 5d         82  %
 CER:      24.3  mm     G. Age:  22w 2d         73  %

 LV:        5.1  mm
 CM:        5.3  mm

 Est. FW:     522  gm      1 lb 2 oz     62  %
Gestational Age

 LMP:           22w 2d        Date:  [DATE]                 EDD:   [DATE]
 U/S Today:     22w 4d                                        EDD:   [DATE]
 Best:          22w 2d     Det. By:  LMP  ([DATE])          EDD:   [DATE]
Anatomy

 Cranium:               Appears normal         LVOT:                   Appears normal
 Cavum:                 Appears normal         Aortic Arch:            Appears normal
 Ventricles:            Appears normal         Ductal Arch:            Appears normal
 Choroid Plexus:        Appears normal         Diaphragm:              Appears normal
 Cerebellum:            Appears normal         Stomach:                Appears normal, left
                                                                       sided
 Posterior Fossa:       Appears normal         Abdomen:                Appears normal
 Nuchal Fold:           Not applicable (>20    Abdominal Wall:         Appears nml (cord
                        wks GA)                                        insert, abd wall)
 Face:                  Appears normal         Cord Vessels:           Appears normal (3
                        (orbits and profile)                           vessel cord)
 Lips:                  Appears normal         Kidneys:                Appear normal
 Palate:                Not well visualized    Bladder:                Appears normal
 Thoracic:              Appears normal         Spine:                  Appears normal
 Heart:                 Appears normal         Upper Extremities:      Appears normal
                        (4CH, axis, and
                        situs)
 RVOT:                  Not well visualized    Lower Extremities:      Appears normal

 Other:  Technically difficult due to fetal position.
Targeted Anatomy

 Head/Neck
 Nasal Bone:            Present                Mandible:               Appears normal
 Orbits/Eyes:           Nml w/ lenses          Maxilla:                Appears normal

 Thorax
 SVC:                   Appears normal         IVC:                    Appears normal

 Extremities
 Lt Hand:               Open hand nml          Lt Foot:                Nml heel/foot
 Rt Hand:               Open hand nml          Rt Foot:                Nml heel/foot
 Other
 Genitalia:             Normal Male
Cervix Uterus Adnexa

 Cervix
 Length:            3.7  cm.
 Normal appearance by transabdominal scan.

 Uterus
 No abnormality visualized.

 Right Ovary
 Not visualized.

 Left Ovary
 Not visualized.

 Cul De Sac
 No free fluid seen.

 Adnexa
 No abnormality visualized.
Impression

 We performed fetal anatomical survey.  Amniotic fluid is
 normal and good fetal activity seen.  Fetal biometry is
 consistent with the previously established dates.  No markers
 of aneuploidies or fetal structural defects are seen.
 Placenta is low-lying and is 1.4 cm from the internal os.
 As maternal obesity limits resolution of images, failure to
 detect anomalies are more common.
 xxxxxxxxxxxxxxxxxxxxxxxxxxxxxxxxxxxxxxxxxxxxxxxxxxxxxxxxx
 xxxx
 Consultation (see [REDACTED] )
 [facility] the [facility]MAN [facility] She is G1 P0 at 22-weeks' gestation
 and is here for fetal anatomy scan and MFM consultation
 because of recent diagnosis of idiopathic intracranial
 hypertension.
 Patient has been having whooshing sound in her ears for few
 months.  He developed intermittent headaches lasting 1 hour
 and pain behind the neck for about 2 weeks.  She had 2 ED
 visits including the last visit for complaints of double vision.
 She was later referred to a neurologist.  MRI brain without
 contrast was performed and was reported as normal.  Patient
 had papilledema and lumbar puncture was performed on
 [DATE] and an opening pressure of 41 cm was seen
 (elevated).  Patient had symptomatic relief after lumbar
 puncture.  She does not have double vision now.
 Review of systems: No neck swelling or chest pain or
 palpitations or abdominal pain or joint pains.  No recurrent
 urinary infections or vaginal bleeding.  She has intermittent
 nausea.  No vomiting or diarrhea or constipation.
 Past medical history: No history of hypertension or diabetes
 or any other chronic medical conditions.
 Past surgical history: Nil of note.
 Medications: Prenatal vitamins, Zofran as needed.
 Allergies: No known drug allergies.
 Social history: Denies tobacco or drug or alcohol use.  She is
 single and her partner is in good health both patient and her
 [REDACTED] are African Americans.
 Family history: Father has sickle cell trait.  Patient does not
 know if she has sickle cell trait.  Both parents are alive and do
 not have hypertension or diabetes.  No history of venous
 thromboembolism in the family.
 Prenatal course: On cell free fetal DNA screening, the risks of
 fetal aneuploidies are not increased.  MSAFP screening
 showed low risk for open neural tube defects.
 Our concerns include:
 Idiopathic intracranial hypertension (IIH)
 Also known as pseudotumor cerebri is characterized
 increased CSF pressure (with normal composition) and
 absence of any intracranial lesions on neuroimaging. It most-
 commonly presents as headache. Other symptoms include
 retrobulbar pain, photopsia or diplopia. Visual loss can also
 occur, which although transient, can be permanent in about
 20-25% of cases in the long run.
 Pregnancy does not worsen this condition in the short or long
 term. No increased visual loss rates have been reported in
 women who had pregnancies. IHI also does not adversely
 affect the pregnancy. No increase in miscarriages or preterm
 deliveries is reported.
 Treatment: Acetazolamide is the most-commonly used drug
 in this condition. It reduces the CSF production up to 50% in
 some cases, thereby, providing relief in symptoms. It is a
 category C drug. The Collaborative Perinatal Project found
 non increased congenital malformations in over [Y7] women
 exposed to this drug. If symptoms persist or become worse,
 acetazolamide should be considered.
 Common side-effects include tingling in the hands and toes,
 metallic taste when carbonated drinks are consumed.
 Breastfeeding: According to the American Academy of
 Pediatrics, acetazolamide is compatible with breastfeeding.
 Other drugs used to treat IHI are topiramate and steroids.
 Topiramate can be used in pregnancy. A small increase in
 the incidence of facial clefts has been reported with this drug.
 Corticosteroids should be avoided as they tend to cause
 weight gain (worsen IHI).
 Regional anesthesia is safe and vaginal delivery is safe.
 Labor increases cardiac output and, consequently, increase
 CSF pressure. But this does not increase the risk for visual
 loss. Epidural analgesia with reasonable maternal effort in the
 second stage is safe. Outlet forceps or vacuum-assisted
 delivery may be required.
Recommendations

 -Acetazolamide can be safely given at this gestational age if
 symptoms persist or become severe.
 -An appointment was made for her to return in 4 weeks for
 fetal growth assessment.
 -Fetal growth assessments every 4 weeks.
 -Anesthesiology consultation in the third trimester.
 -Continue with neurologist.
 -Vaginal delivery is not contraindicated.
 -Screen for Sickle cell carrier status.
                 NEYMAN

## 2021-02-24 NOTE — Progress Notes (Signed)
Maternal-Fetal Medicine   Name: Tracy Keller DOB: 1996-04-17 MRN: [id number] Referring Provider: Nigel Keller, CNM  I had the pleasure of seeing Tracy Keller today at [facility]. She is G1 P0 at 22-weeks' gestation and is here for fetal anatomy scan and MFM consultation because of recent diagnosis of idiopathic intracranial hypertension.  Patient has been having whooshing sound in her ears for few months.  He developed intermittent headaches lasting 1 hour and pain behind the neck for about 2 weeks.  She had 2 ED visits including the last visit for complaints of double vision.  She was later referred to a neurologist.  MRI brain without contrast was performed and was reported as normal.  Patient had papilledema and lumbar puncture was performed on 01/31/2021 and an opening pressure of 41 cm was seen (elevated).  Patient had symptomatic relief after lumbar puncture.  She does not have double vision now.  Review of systems: No neck swelling or chest pain or palpitations or abdominal pain or joint pains.  No recurrent urinary infections or vaginal bleeding.  She has intermittent nausea.  No vomiting or diarrhea or constipation.  Past medical history: No history of hypertension or diabetes or any other chronic medical conditions. Past surgical history: Nil of note. Medications: Prenatal vitamins, Zofran as needed. Allergies: No known drug allergies. Social history: Denies tobacco or drug or alcohol use.  She is single and her partner is in good health both patient and her partners are African Americans.   Family history: Father has sickle cell trait.  Patient does not know if she has sickle cell trait.  Both parents are alive and do not have hypertension or diabetes.  No history of venous thromboembolism in the family.  Prenatal course: On cell free fetal DNA screening, the risks of fetal aneuploidies are not increased.  MSAFP screening showed low risk for open neural tube  defects.  Ultrasound We performed fetal anatomical survey.  Amniotic fluid is normal and good fetal activity seen.  Fetal biometry is consistent with the previously established dates.  No markers of aneuploidies or fetal structural defects are seen.  Placenta is low-lying and is 1.4 cm from the internal os.  As maternal obesity limits resolution of images, failure to detect anomalies are more common .  Our concerns include: Idiopathic intracranial hypertension (IIH) Also known as pseudotumor cerebri is characterized increased CSF pressure (with normal composition) and absence of any intracranial lesions on neuroimaging. It most-commonly presents as headache. Other symptoms include retrobulbar pain, photopsia or diplopia. Visual loss can also occur, which although transient, can be permanent in about 20-25% of patients refractory to treatment.  Pregnancy does not worsen this condition in the short or long term. No increased visual loss rates have been reported in women who had pregnancies. IHI also does not adversely affect the pregnancy. No increase in miscarriages or preterm deliveries is reported.  Treatment: Acetazolamide is the most-commonly used drug in this condition. It reduces the CSF production up to 50% in some cases, thereby, providing relief in symptoms. It is a category C drug. The [facility] found non increased congenital malformations in over 1024 women exposed to this drug. If symptoms persist or become worse, acetazolamide should be considered.  Common side-effects include tingling in the hands and toes, metallic taste when carbonated drinks are consumed.  Breastfeeding: According to the American Academy of Pediatrics, acetazolamide is compatible with breastfeeding.   Other drugs used to treat IHI are topiramate  and steroids. Topiramate can be used in pregnancy. A small increase in the incidence of facial clefts has been reported with this drug.  Corticosteroids should be avoided as they tend to cause weight gain (worsen IHI).  Regional anesthesia is safe and vaginal delivery is safe. Labor increases cardiac output and, consequently, increase CSF pressure. But this does not increase the risk for visual loss. Epidural analgesia with reasonable maternal effort in the second stage is safe. Outlet forceps or vacuum-assisted delivery may be required.  Recommendations -Acetazolamide can be safely given at this gestational age if symptoms persist or become severe. -An appointment was made for her to return in 4 weeks for fetal growth assessment. -Fetal growth assessments every 4 weeks. -Anesthesiology consultation in the third trimester. -Continue with neurologist. -Vaginal delivery is not contraindicated. -Screen for Sickle cell carrier status.  Thank you for consultation.  If you have any questions or concerns, please contact me the [facility].  Consultation including face-to-face (more than 50%) counseling 45 minutes.

## 2021-03-24 ENCOUNTER — Encounter: Payer: Self-pay | Admitting: *Deleted

## 2021-03-24 ENCOUNTER — Ambulatory Visit: Payer: BC Managed Care – PPO | Admitting: *Deleted

## 2021-03-24 ENCOUNTER — Other Ambulatory Visit: Payer: Self-pay | Admitting: *Deleted

## 2021-03-24 ENCOUNTER — Other Ambulatory Visit: Payer: Self-pay

## 2021-03-24 ENCOUNTER — Ambulatory Visit: Payer: BC Managed Care – PPO | Attending: Obstetrics and Gynecology

## 2021-03-24 VITALS — BP 120/71 | HR 98

## 2021-03-24 DIAGNOSIS — Z363 Encounter for antenatal screening for malformations: Secondary | ICD-10-CM | POA: Diagnosis not present

## 2021-03-24 DIAGNOSIS — O2690 Pregnancy related conditions, unspecified, unspecified trimester: Secondary | ICD-10-CM | POA: Insufficient documentation

## 2021-03-24 DIAGNOSIS — O4442 Low lying placenta NOS or without hemorrhage, second trimester: Secondary | ICD-10-CM | POA: Insufficient documentation

## 2021-03-24 DIAGNOSIS — G932 Benign intracranial hypertension: Secondary | ICD-10-CM | POA: Insufficient documentation

## 2021-03-24 DIAGNOSIS — Z6841 Body Mass Index (BMI) 40.0 and over, adult: Secondary | ICD-10-CM

## 2021-03-24 DIAGNOSIS — Z3A26 26 weeks gestation of pregnancy: Secondary | ICD-10-CM | POA: Diagnosis not present

## 2021-03-24 DIAGNOSIS — E669 Obesity, unspecified: Secondary | ICD-10-CM

## 2021-03-24 DIAGNOSIS — O99212 Obesity complicating pregnancy, second trimester: Secondary | ICD-10-CM | POA: Diagnosis not present

## 2021-03-24 IMAGING — US US MFM OB FOLLOW-UP
1 series · 13 of 28 positions shown · non-contrast
Comparison: none

[Series 1: us mfm ob follow-up · 47 acquisitions, 13 frames shown]
[im 2/47]
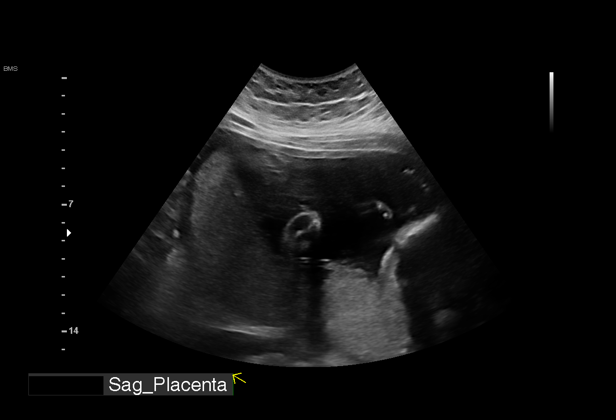
[im 6/47]
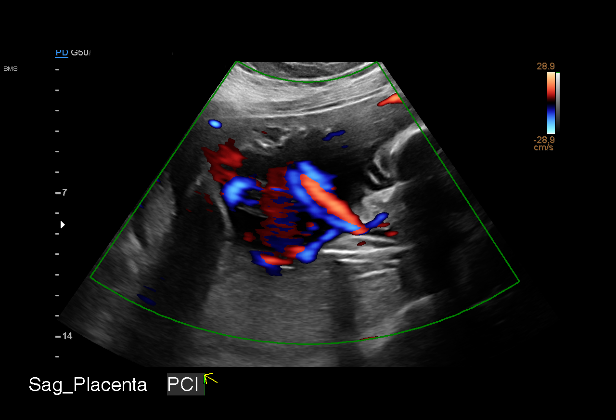
[im 9/47]
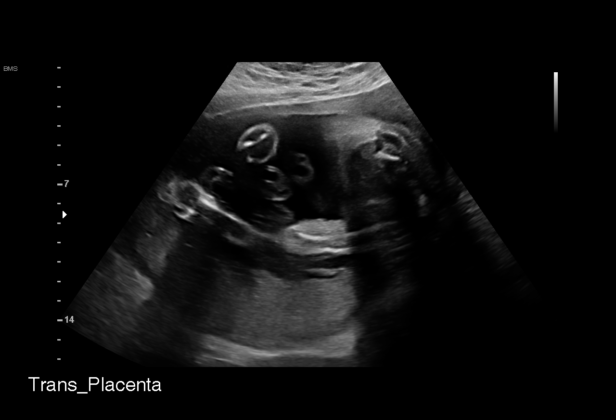
[im 12/47]
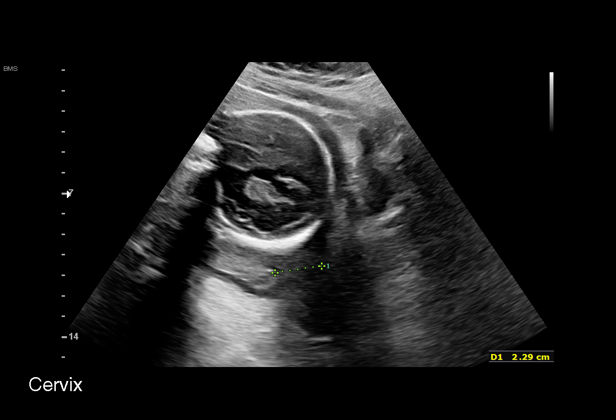
[im 16/47]
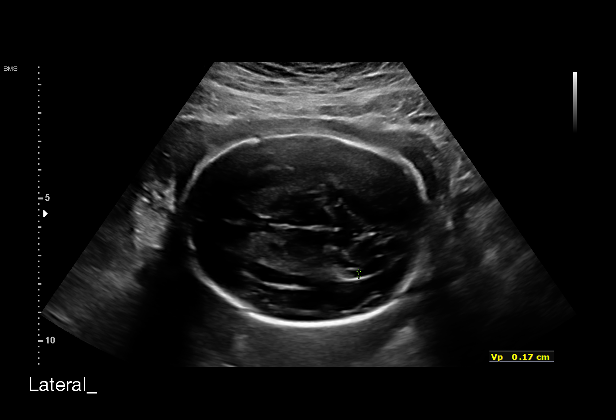
[im 19/47]
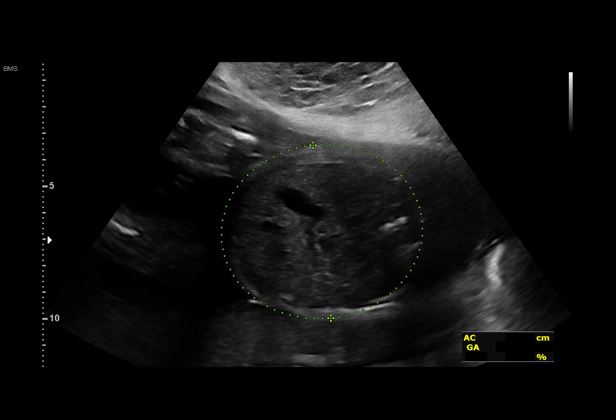
[im 24/47]
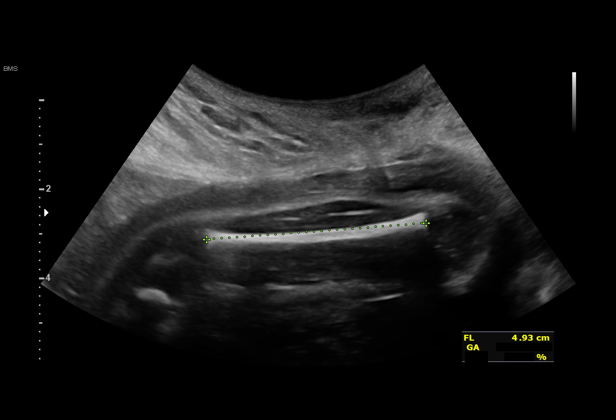
[im 28/47]
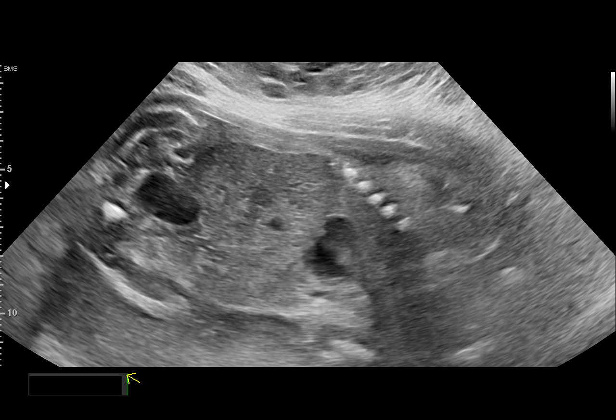
[im 31/47]
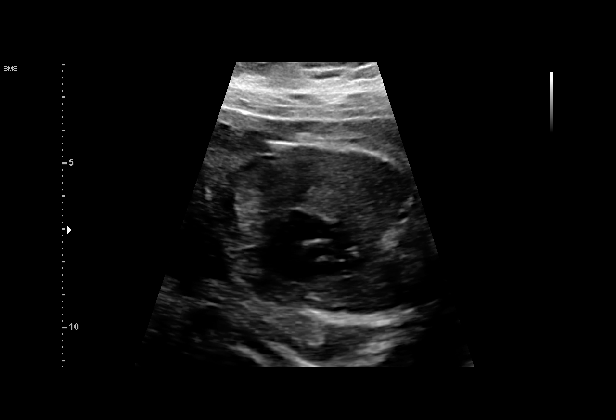
[im 35/47]
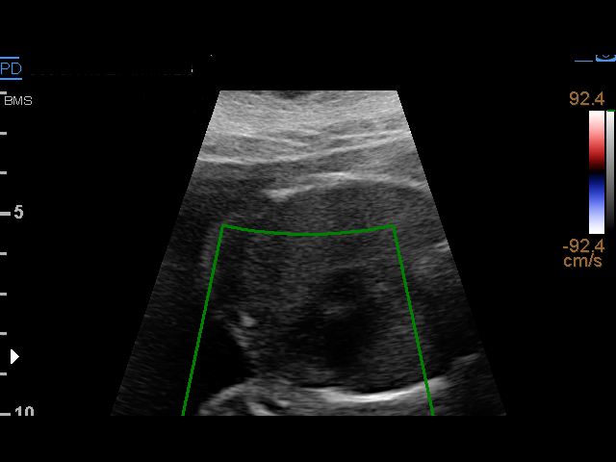
[im 38/47]
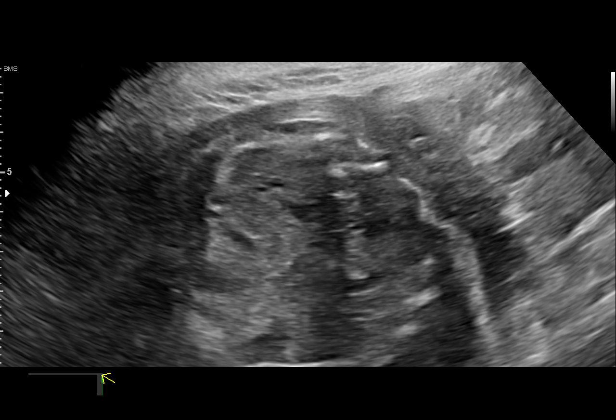
[im 41/47]
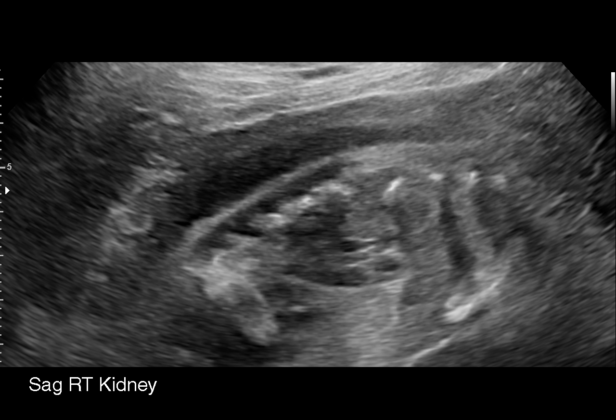
[im 45/47]
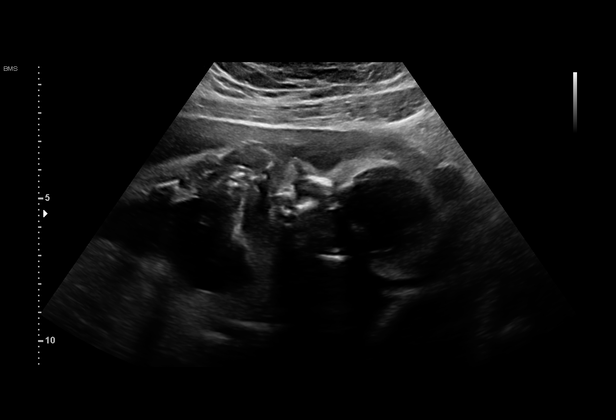

[13 of 28 positions shown; findings below may reference images not displayed]

Addendum:\.br----------------------------------------------------------------------
----------------------------------------------------------------------

----------------------------------------------------------------------

                                                            Obstetrics &
                                                            Gynecology
                                                            [W5] BONDAR
                                                            BONDAR.
                   CNM
----------------------------------------------------------------------

----------------------------------------------------------------------

----------------------------------------------------------------------
Indications

 Medical complication of pregnancy (IIH -       [W5]
 new dx)
 Obesity complicating pregnancy, second         [W5]
 trimester (pregravid BMI 40)
 Antenatal screening for malformations          [W5]
 Low lying placenta, antepartum                 [W5]
 26 weeks gestation of pregnancy
----------------------------------------------------------------------
Fetal Evaluation

 Num Of Fetuses:         1
 Fetal Heart Rate(bpm):  155
 Cardiac Activity:       Observed
 Presentation:           Cephalic
 Placenta:               Posterior, low-lying
 P. Cord Insertion:      Visualized

 Amniotic Fluid
 AFI FV:      Within normal limits

                             Largest Pocket(cm)

----------------------------------------------------------------------
Biometry

 BPD:      66.5  mm     G. Age:  26w 6d         58  %    CI:         69.1   %    70 - 86
                                                         FL/HC:      19.5   %    18.6 -
 HC:      255.5  mm     G. Age:  27w 5d         74  %    HC/AC:      1.13        1.04 -
 AC:      226.3  mm     G. Age:  27w 0d         65  %    FL/BPD:     75.0   %    71 - 87
 FL:       49.9  mm     G. Age:  26w 6d         53  %    FL/AC:      22.1   %    20 - 24
 HUM:      43.8  mm     G. Age:  26w 0d         41  %
 LV:        1.7  mm

 Est. FW:    [W5]  gm      2 lb 4 oz     70  %
----------------------------------------------------------------------
Gestational Age

 LMP:           26w 2d        Date:  [DATE]                 EDD:   [DATE]
 U/S Today:     27w 1d                                        EDD:   [DATE]
 Best:          26w 2d     Det. By:  LMP  ([DATE])          EDD:   [DATE]
----------------------------------------------------------------------
Anatomy

 Cranium:               Appears normal         LVOT:                   Previously seen
 Cavum:                 Appears normal         Aortic Arch:            Previously seen
 Ventricles:            Appears normal         Ductal Arch:            Previously seen
 Choroid Plexus:        Previously seen        Diaphragm:              Appears normal
 Cerebellum:            Previously seen        Stomach:                Appears normal, left
                                                                       sided
 Posterior Fossa:       Previously seen        Abdomen:                Appears normal
 Nuchal Fold:           Not applicable (>20    Abdominal Wall:         Previously seen
                        wks GA)
 Face:                  Orbits and profile     Cord Vessels:           Previously seen
                        previously seen
 Lips:                  Previously seen        Kidneys:                Appear normal
 Palate:                Not well visualized    Bladder:                Appears normal
 Thoracic:              Previously seen        Spine:                  Previously seen
 Heart:                 Previously seen        Upper Extremities:      Previously seen
 RVOT:                  Appears normal         Lower Extremities:      Previously seen

 Other:  Technically difficult due to maternal habitus.
----------------------------------------------------------------------
Cervix Uterus Adnexa

 Cervix
 Not visualized (advanced GA >[W5])

 Uterus
 No abnormality visualized.

 Right Ovary
 Not visualized.

 Left Ovary
 Within normal limits.

 Cul De Sac
 No free fluid seen.
 Adnexa
 No adnexal mass visualized.
----------------------------------------------------------------------
Impression

 Idiopathic intracranial hypertension.  Patient return for fetal
 growth assessment.  She reports her symptoms have
 improved and is not taking any medications.

 Fetal biometry is consistent with her previously-established
 dates .Amniotic fluid is normal and good fetal activity is seen
 .Fetal anatomical survey was completed and appears normal.
 Placenta appears low-lying.  Patient does not give history of
 vaginal bleeding.
----------------------------------------------------------------------
Recommendations

 -An appointment was made for her to return in 4 weeks for
 fetal growth assessment and transvaginal ultrasound to
 evaluate the placental position.
----------------------------------------------------------------------
                      BONDAR
----------------------------------------------------------------------

*** End of Addendum ***\.br----------------------------------------------------------------------
----------------------------------------------------------------------

----------------------------------------------------------------------

                                                            Obstetrics &
                                                            Gynecology
                                                            [W5] BONDAR
                                                            BONDAR.
                   CNM
----------------------------------------------------------------------

----------------------------------------------------------------------

----------------------------------------------------------------------
Indications

 Medical complication of pregnancy (IIH -       [W5]
 new dx)
 Obesity complicating pregnancy, second         [W5]
 trimester (pregravid BMI 40)
 Antenatal screening for malformations          [W5]
 Low lying placenta, antepartum                 [W5]
 26 weeks gestation of pregnancy
----------------------------------------------------------------------
Fetal Evaluation

 Num Of Fetuses:         1
 Fetal Heart Rate(bpm):  155
 Cardiac Activity:       Observed
 Presentation:           Cephalic
 Placenta:               Posterior, low-lying
 P. Cord Insertion:      Visualized

 Amniotic Fluid
 AFI FV:      Within normal limits

                             Largest Pocket(cm)


 Comment:    Posterior low lying placenta resolved at 2.29cm from internal os.
----------------------------------------------------------------------
Biometry

 BPD:      66.5  mm     G. Age:  26w 6d         58  %    CI:         69.1   %    70 - 86
                                                         FL/HC:      19.5   %    18.6 -
 HC:      255.5  mm     G. Age:  27w 5d         74  %    HC/AC:      1.13        1.04 -
 AC:      226.3  mm     G. Age:  27w 0d         65  %    FL/BPD:     75.0   %    71 - 87
 FL:       49.9  mm     G. Age:  26w 6d         53  %    FL/AC:      22.1   %    20 - 24
 HUM:      43.8  mm     G. Age:  26w 0d         41  %
 LV:        1.7  mm

 Est. FW:    [W5]  gm      2 lb 4 oz     70  %
----------------------------------------------------------------------
Gestational Age

 LMP:           26w 2d        Date:  [DATE]                 EDD:   [DATE]
 U/S Today:     27w 1d                                        EDD:   [DATE]
 Best:          26w 2d     Det. By:  LMP  ([DATE])          EDD:   [DATE]
----------------------------------------------------------------------
Anatomy

 Cranium:               Appears normal         LVOT:                   Previously seen
 Cavum:                 Appears normal         Aortic Arch:            Previously seen
 Ventricles:            Appears normal         Ductal Arch:            Previously seen
 Choroid Plexus:        Previously seen        Diaphragm:              Appears normal
 Cerebellum:            Previously seen        Stomach:                Appears normal, left
                                                                       sided
 Posterior Fossa:       Previously seen        Abdomen:                Appears normal
 Nuchal Fold:           Not applicable (>20    Abdominal Wall:         Previously seen
                        wks GA)
 Face:                  Orbits and profile     Cord Vessels:           Previously seen
                        previously seen
 Lips:                  Previously seen        Kidneys:                Appear normal
 Palate:                Not well visualized    Bladder:                Appears normal
 Thoracic:              Previously seen        Spine:                  Previously seen
 Heart:                 Previously seen        Upper Extremities:      Previously seen
 RVOT:                  Appears normal         Lower Extremities:      Previously seen

 Other:  Technically difficult due to maternal habitus.
----------------------------------------------------------------------
Cervix Uterus Adnexa

 Cervix
 Not visualized (advanced GA >[W5])

 Uterus
 No abnormality visualized.

 Right Ovary
 Not visualized.

 Left Ovary
 Within normal limits.

 Cul De Sac
 No free fluid seen.
 Adnexa
 No adnexal mass visualized.
----------------------------------------------------------------------
Impression

 Idiopathic intracranial hypertension.  Patient return for fetal
 growth assessment.  She reports her symptoms have
 improved and is not taking any medications.

 Fetal biometry is consistent with her previously-established
 dates .Amniotic fluid is normal and good fetal activity is seen
 .Fetal anatomical survey was completed and appears normal.
 Placenta appears low-lying.  Patient does not give history of
 vaginal bleeding.
----------------------------------------------------------------------
Recommendations

 -An appointment was made for her to return in 4 weeks for
 fetal growth assessment and transvaginal ultrasound to
 evaluate the placental position.
----------------------------------------------------------------------
                 BONDAR
----------------------------------------------------------------------

## 2021-04-21 ENCOUNTER — Ambulatory Visit: Payer: BC Managed Care – PPO | Admitting: *Deleted

## 2021-04-21 ENCOUNTER — Ambulatory Visit: Payer: BC Managed Care – PPO | Attending: Obstetrics and Gynecology

## 2021-04-21 ENCOUNTER — Other Ambulatory Visit: Payer: Self-pay

## 2021-04-21 ENCOUNTER — Encounter: Payer: Self-pay | Admitting: *Deleted

## 2021-04-21 ENCOUNTER — Other Ambulatory Visit: Payer: Self-pay | Admitting: *Deleted

## 2021-04-21 VITALS — BP 126/64 | HR 105

## 2021-04-21 DIAGNOSIS — Z6841 Body Mass Index (BMI) 40.0 and over, adult: Secondary | ICD-10-CM | POA: Diagnosis present

## 2021-04-21 DIAGNOSIS — G932 Benign intracranial hypertension: Secondary | ICD-10-CM

## 2021-04-21 DIAGNOSIS — O99213 Obesity complicating pregnancy, third trimester: Secondary | ICD-10-CM | POA: Diagnosis not present

## 2021-04-21 DIAGNOSIS — Z3689 Encounter for other specified antenatal screening: Secondary | ICD-10-CM

## 2021-04-21 DIAGNOSIS — E669 Obesity, unspecified: Secondary | ICD-10-CM

## 2021-04-21 DIAGNOSIS — O4443 Low lying placenta NOS or without hemorrhage, third trimester: Secondary | ICD-10-CM | POA: Diagnosis not present

## 2021-04-21 DIAGNOSIS — O4442 Low lying placenta NOS or without hemorrhage, second trimester: Secondary | ICD-10-CM | POA: Diagnosis present

## 2021-04-21 DIAGNOSIS — Z3A30 30 weeks gestation of pregnancy: Secondary | ICD-10-CM | POA: Diagnosis not present

## 2021-04-21 DIAGNOSIS — R638 Other symptoms and signs concerning food and fluid intake: Secondary | ICD-10-CM

## 2021-04-21 IMAGING — US US MFM OB FOLLOW-UP
1 series · 14 of 28 positions shown · non-contrast
Comparison: none

[Series 1: us mfm ob follow-up · 56 acquisitions, 14 frames shown]
[im 3/56]
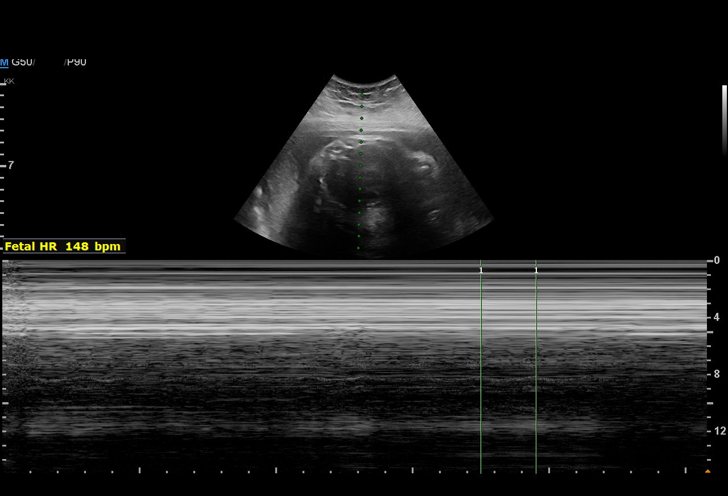
[im 7/56]
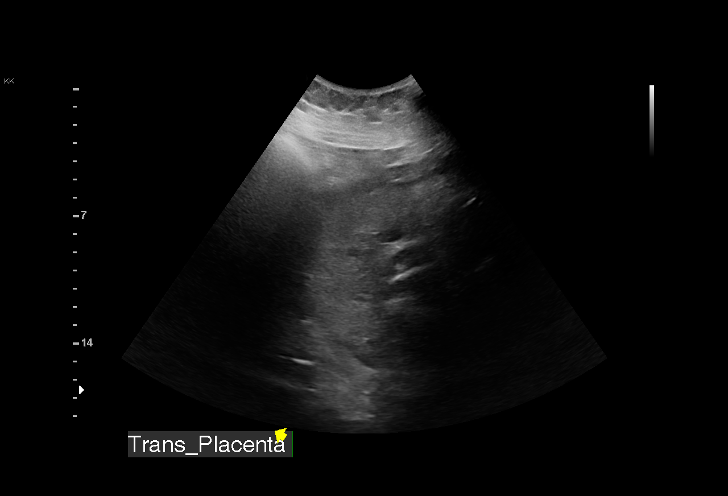
[im 11/56]
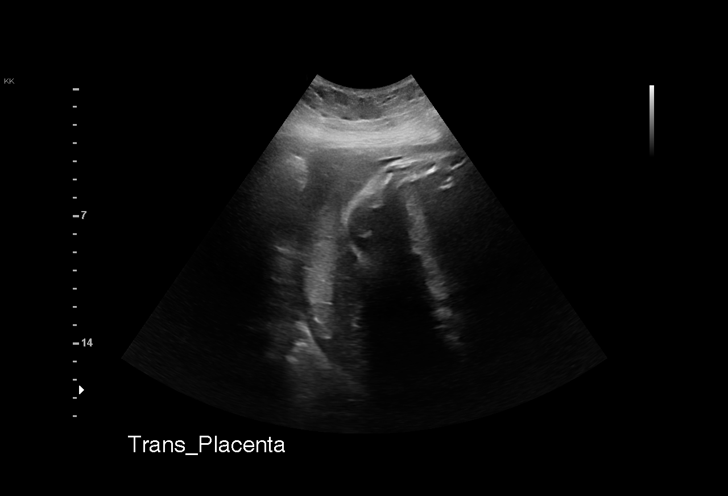
[im 15/56]
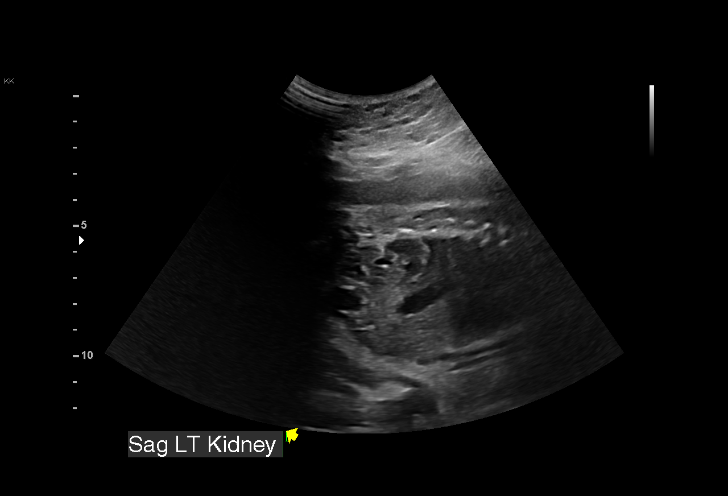
[im 19/56]
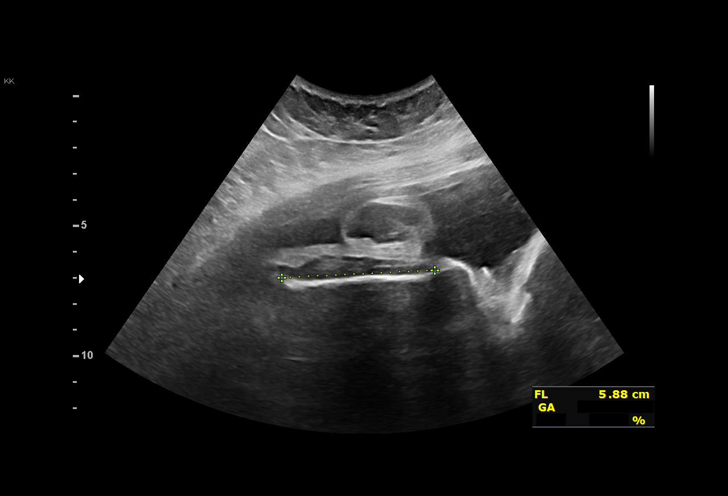
[im 23/56]
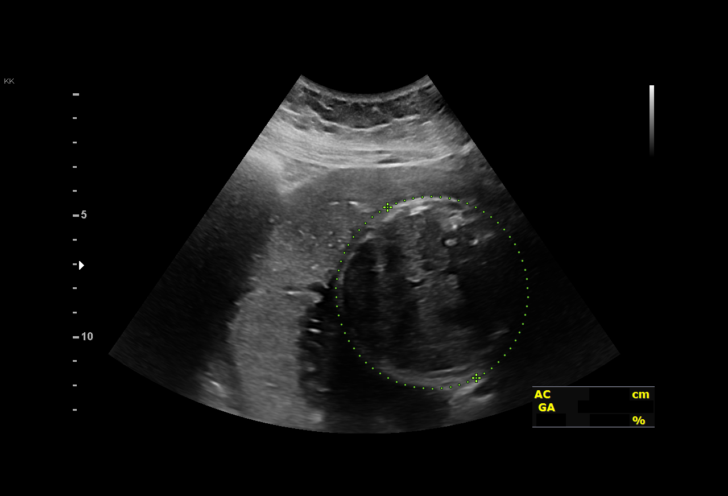
[im 27/56]
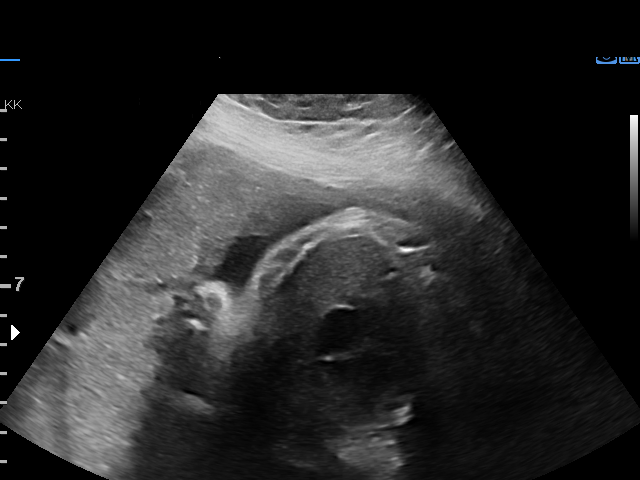
[im 31/56]
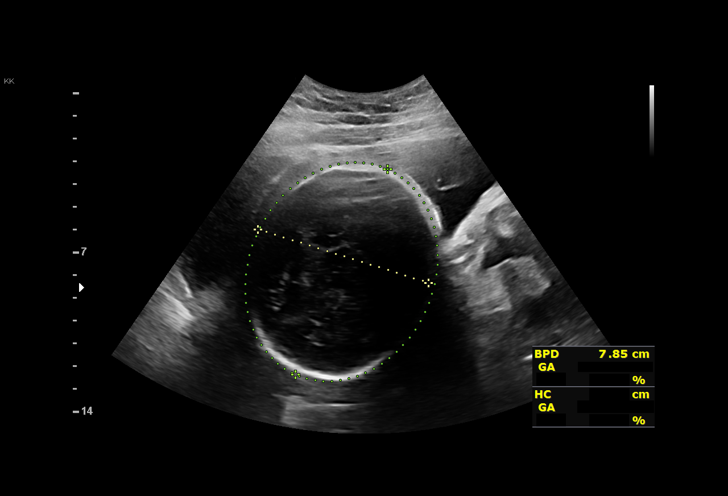
[im 35/56]
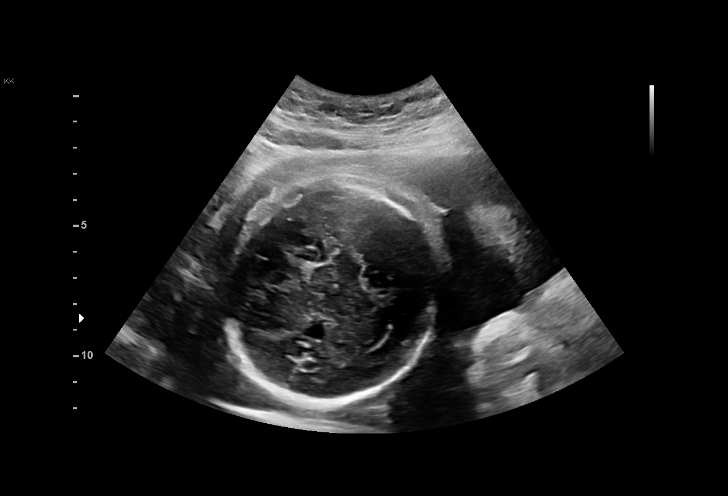
[im 39/56]
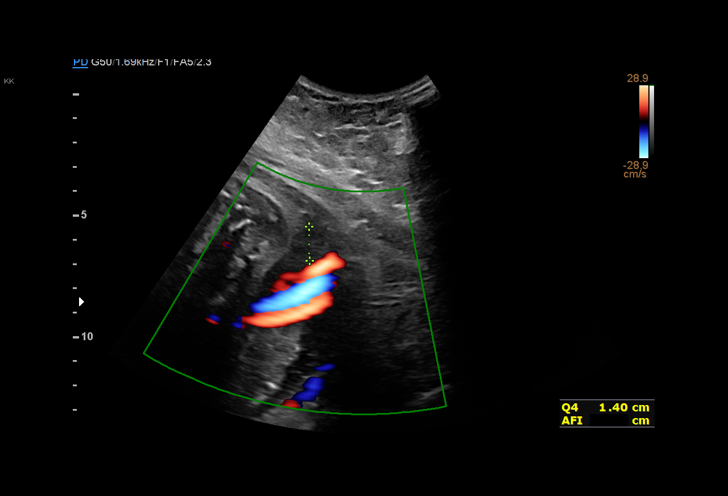
[im 43/56]
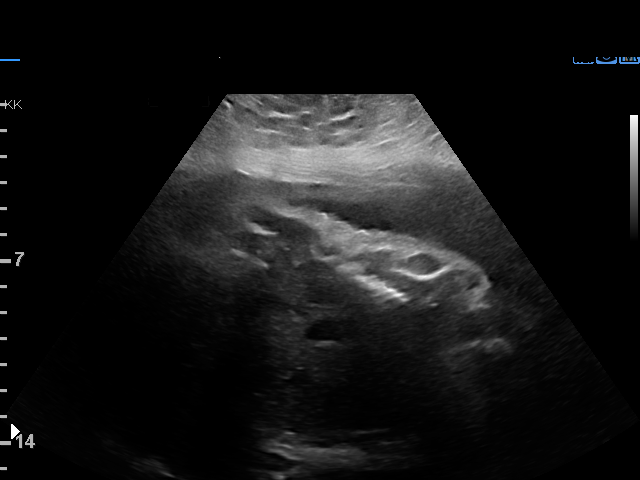
[im 47/56]
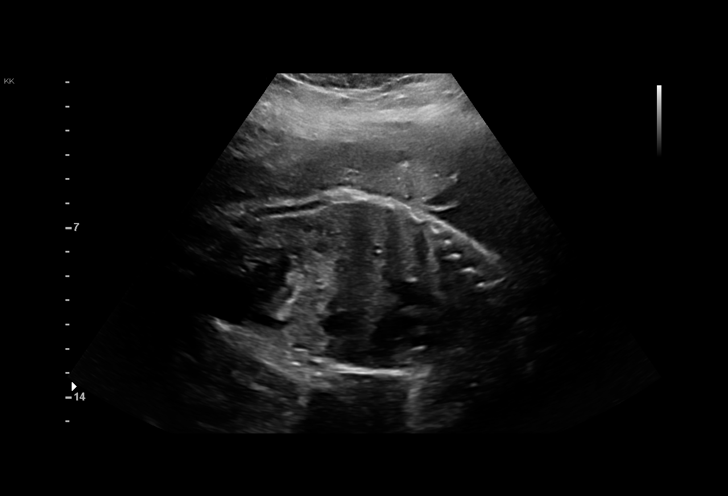
[im 51/56]
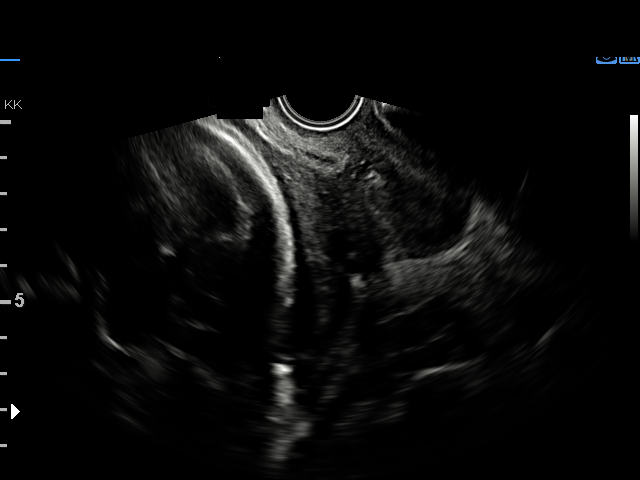
[im 56/56]
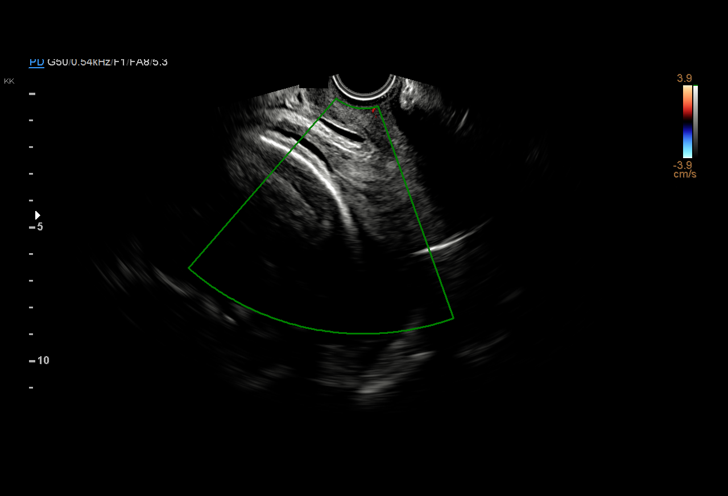

[14 of 28 positions shown; findings below may reference images not displayed]

Obstetrics &
                                                            Gynecology
                                                            [26] MARYURI
                                                            MARYURI.
                   CNM

Indications

 Obesity complicating pregnancy, second         [26]
 trimester (pregravid BMI 40)
 Low lying placenta, antepartum (Resolved)      [26]
 30 weeks gestation of pregnancy
 Medical complication of pregnancy (IIH -       [26]
 new dx)
 Encounter for other antenatal screening        [26]
 follow-up
Fetal Evaluation

 Num Of Fetuses:         1
 Fetal Heart Rate(bpm):  148
 Cardiac Activity:       Observed
 Presentation:           Cephalic
 Placenta:               Posterior
 P. Cord Insertion:      Previously Visualized

 Amniotic Fluid
 AFI FV:      Within normal limits

 AFI Sum(cm)     %Tile       Largest Pocket(cm)
 10.6            18
 RUQ(cm)       RLQ(cm)       LUQ(cm)        LLQ(cm)

Biometry

 BPD:      78.7  mm     G. Age:  31w 4d         78  %    CI:        76.49   %    70 - 86
                                                         FL/HC:      21.4   %    19.2 -
 HC:      285.1  mm     G. Age:  31w 2d         43  %    HC/AC:      1.11        0.99 -
 AC:      256.6  mm     G. Age:  29w 6d         31  %    FL/BPD:     77.5   %    71 - 87
 FL:         61  mm     G. Age:  31w 5d         74  %    FL/AC:      23.8   %    20 - 24

 Est. FW:    [26]  gm      3 lb 9 oz     52  %
Gestational Age

 LMP:           30w 2d        Date:  [DATE]                 EDD:   [DATE]
 U/S Today:     31w 1d                                        EDD:   [DATE]
 Best:          30w 2d     Det. By:  LMP  ([DATE])          EDD:   [DATE]
Anatomy

 Cranium:               Appears normal         LVOT:                   Previously seen
 Cavum:                 Appears normal         Aortic Arch:            Previously seen
 Ventricles:            Appears normal         Ductal Arch:            Previously seen
 Choroid Plexus:        Previously seen        Diaphragm:              Appears normal
 Cerebellum:            Previously seen        Stomach:                Appears normal, left
                                                                       sided
 Posterior Fossa:       Previously seen        Abdomen:                Appears normal
 Nuchal Fold:           Not applicable (>20    Abdominal Wall:         Previously seen
                        wks GA)
 Face:                  Orbits and profile     Cord Vessels:           Previously seen
                        previously seen
 Lips:                  Previously seen        Kidneys:                Appear normal
 Palate:                Not well visualized    Bladder:                Appears normal
 Thoracic:              Previously seen        Spine:                  Previously seen
 Heart:                 Appears normal         Upper Extremities:      Previously seen
                        (4CH, axis, and
                        situs)
 RVOT:                  Previously seen        Lower Extremities:      Previously seen

 Other:  Technically difficult due to maternal habitus.
Cervix Uterus Adnexa

 Cervix
 Length:              4  cm.
 Measured transvaginally.
Impression

 Follow up growth due to maternal elevated BMI 40 and
 idiopathic intracranial hypertension.
 Normal interval growth with measurements consistent with
 dates
 Good fetal movement and amniotic fluid volume

 She reports no additional s/sx related to IIH.

 The placenta is no longer low lying.
Recommendations

 Continue serial growth exams
 Initiate weekly testing at 34-36 weeks.

## 2021-04-30 ENCOUNTER — Telehealth: Payer: Self-pay | Admitting: Diagnostic Neuroimaging

## 2021-04-30 NOTE — Telephone Encounter (Signed)
Pt called stated she experienced some numbness on her left side along with a severe headache. Pt is now pregnant also. Pt asking does she need to be seen sooner.

## 2021-04-30 NOTE — Telephone Encounter (Signed)
Called patient and advised she may monitor if symptoms are mild. She stated that she has a follow up with Dr Marjory Lies 05/13/21, will keep that FU. I advised she call if symptoms return and are worse or different. She verbalized understanding, appreciation.

## 2021-04-30 NOTE — Telephone Encounter (Signed)
Called patient who stated she's had similar episode x 3, first one was 2 years ago, then at [redacted] weeks pregnant, then 3 days ago. It begins with pins, needles, tingling in left hand or arm and goes to lips, gum, once went to her leg. It is followed by a severe pounding headache. Three days ago the  numbness lasted  10 min, headache lasted close to a day. She has seen eye dr 2-3 x and  sees him again 12/12. At her last visit he said swelling was less but still present. She will be 32 weeks on Sat. She saw OB today who told her baby is okay. OB recommended she call neurologist.  I advised will let Dr Marjory Lies know and call her back. Patient verbalized understanding, appreciation.

## 2021-05-07 ENCOUNTER — Inpatient Hospital Stay (HOSPITAL_COMMUNITY)
Admission: AD | Admit: 2021-05-07 | Discharge: 2021-05-07 | Disposition: A | Discharge status: Home / Self Care | Payer: BC Managed Care – PPO | Attending: Obstetrics & Gynecology | Admitting: Obstetrics & Gynecology

## 2021-05-07 ENCOUNTER — Other Ambulatory Visit: Payer: Self-pay

## 2021-05-07 ENCOUNTER — Encounter (HOSPITAL_COMMUNITY): Payer: Self-pay | Admitting: Obstetrics & Gynecology

## 2021-05-07 DIAGNOSIS — Z3A32 32 weeks gestation of pregnancy: Secondary | ICD-10-CM | POA: Diagnosis not present

## 2021-05-07 DIAGNOSIS — M6208 Separation of muscle (nontraumatic), other site: Secondary | ICD-10-CM | POA: Diagnosis not present

## 2021-05-07 DIAGNOSIS — O99891 Other specified diseases and conditions complicating pregnancy: Secondary | ICD-10-CM

## 2021-05-07 DIAGNOSIS — O163 Unspecified maternal hypertension, third trimester: Secondary | ICD-10-CM | POA: Insufficient documentation

## 2021-05-07 DIAGNOSIS — X58XXXA Exposure to other specified factors, initial encounter: Secondary | ICD-10-CM | POA: Diagnosis not present

## 2021-05-07 DIAGNOSIS — O26893 Other specified pregnancy related conditions, third trimester: Secondary | ICD-10-CM | POA: Diagnosis present

## 2021-05-07 DIAGNOSIS — T148XXA Other injury of unspecified body region, initial encounter: Secondary | ICD-10-CM

## 2021-05-07 HISTORY — DX: Benign intracranial hypertension: G93.2

## 2021-05-07 LAB — URINALYSIS, ROUTINE W REFLEX MICROSCOPIC
Bilirubin Urine: NEGATIVE
Glucose, UA: NEGATIVE mg/dL
Hgb urine dipstick: NEGATIVE
Ketones, ur: NEGATIVE mg/dL
Leukocytes,Ua: NEGATIVE
Nitrite: NEGATIVE
Protein, ur: NEGATIVE mg/dL
Specific Gravity, Urine: 1.02 (ref 1.005–1.030)
pH: 6.5 (ref 5.0–8.0)

## 2021-05-07 MED ORDER — ACETAMINOPHEN 500 MG PO TABS
1000.0000 mg | ORAL_TABLET | Freq: Once | ORAL | Status: DC
  Filled 2021-05-07: qty 2

## 2021-05-07 NOTE — MAU Note (Signed)
Presents with c/o soreness/pain in mid upper abdomen.  Reports pain is dependent upon certain positions and with getting into a standing position.  States pain started @ 0400 this morning. Denies LOF and VB.  Endorses +FM.

## 2021-05-07 NOTE — MAU Provider Note (Signed)
History     CSN: [id number]  Arrival date and time: 05/07/21 1122   Event Date/Time   First Provider Initiated Contact with Patient 05/07/21 1224      Chief Complaint  Patient presents with   Abdominal Pain   Tracy Keller is a 25 y.o. G1P0 at [redacted]w[redacted]d who presents today with upper abdominal pain that started around 0400 today. She states that she was doing her hair and had her hands over her head for a long time. Now it feels sore, but the pain was off and on. She states that it was worse while the baby was moving. She denies any VB or  LOF. Reports normal fetal movement. She took a warm bath, but that did not help. She has eaten, and there was no change (worse or better with eating). She is followed for intercranial hypertension with this pregnancy. No meds at this time. She is just monitored for that. She had LP in September.   Abdominal Pain This is a new problem. The current episode started today (around 0400). The problem is unchanged. The pain is located in the periumbilical region and epigastric region. The pain is at a severity of 3/10. Quality: "sore" it feels like I worked out. Treatments tried: warm bath, drank water. The treatment provided no improvement relief.   OB History     Gravida  1   Para      Term      Preterm      AB      Living         SAB      IAB      Ectopic      Multiple      Live Births              Past Medical History:  Diagnosis Date   BMI 35.0-35.9,adult    Intracranial hypertension    Medical history non-contributory     Past Surgical History:  Procedure Laterality Date   WISDOM TOOTH EXTRACTION      Family History  Problem Relation Age of Onset   Pseudotumor cerebri Neg Hx     Social History   Tobacco Use   Smoking status: Never   Smokeless tobacco: Never  Vaping Use   Vaping Use: Never used  Substance Use Topics   Alcohol use: Never   Drug use: Never    Allergies:  Allergies  Allergen Reactions   Other      seasonal allergies    Medications Prior to Admission  Medication Sig Dispense Refill Last Dose   ondansetron (ZOFRAN ODT) 4 MG disintegrating tablet Take 1 tablet (4 mg total) by mouth every 6 (six) hours as needed for nausea. 20 tablet 0 05/06/2021   Prenatal MV & Min w/FA-DHA (PRENATAL ADULT GUMMY/DHA/FA PO) Take 2 tablets by mouth at bedtime.   05/07/2021   acetaminophen (TYLENOL) 500 MG tablet Take 1,000 mg by mouth every 6 (six) hours as needed for moderate pain or headache. (Patient not taking: Reported on 04/21/2021)      Blood Pressure Monitoring (BLOOD PRESSURE CUFF) MISC For home blood pressure monitoring during pregnancy 1 each 0     Review of Systems  Gastrointestinal:  Positive for abdominal pain.  Physical Exam   Blood pressure (!) 103/52, pulse 95, temperature 97.9 F (36.6 C), temperature source Oral, resp. rate 19, height 5' 3.5" (1.613 m), weight 106.6 kg, last menstrual period 09/21/2020, SpO2 99 %.  Physical Exam Vitals and nursing  note reviewed.  Constitutional:      General: She is not in acute distress. HENT:     Head: Normocephalic.  Eyes:     Pupils: Pupils are equal, round, and reactive to light.  Cardiovascular:     Rate and Rhythm: Normal rate.  Pulmonary:     Effort: Pulmonary effort is normal.  Abdominal:     Palpations: Abdomen is soft.     Tenderness: There is no abdominal tenderness.  Skin:    General: Skin is warm and dry.  Neurological:     Mental Status: She is alert and oriented to person, place, and time.  Psychiatric:        Mood and Affect: Mood normal.        Behavior: Behavior normal.   NST:  Baseline: 135 Variability: moderate Accels: 15x15 Decels: none Toco: none Reactive/Appropriate for GA  Results for orders placed or performed during the hospital encounter of 05/07/21 (from the past 24 hour(s))  Urinalysis, Routine w reflex microscopic Urine, Clean Catch     Status: None   Collection Time: 05/07/21 11:43 AM  Result  Value Ref Range   Color, Urine YELLOW YELLOW   APPearance CLEAR CLEAR   Specific Gravity, Urine 1.020 1.005 - 1.030   pH 6.5 5.0 - 8.0   Glucose, UA NEGATIVE NEGATIVE mg/dL   Hgb urine dipstick NEGATIVE NEGATIVE   Bilirubin Urine NEGATIVE NEGATIVE   Ketones, ur NEGATIVE NEGATIVE mg/dL   Protein, ur NEGATIVE NEGATIVE mg/dL   Nitrite NEGATIVE NEGATIVE   Leukocytes,Ua NEGATIVE NEGATIVE    MAU Course  Procedures  MDM DW patient that likely has muscle strain related to prolonged use of hands over head, and gravid uterus causing a diastasis. Offered tylenol and ice pack. Ice pack applied and patient reported feeling better, she declined tylenol.    Assessment and Plan   1. Muscle strain   2. Diastasis of rectus abdominis   3. [redacted] weeks gestation of pregnancy    DC home Comfort measures reviewed  3rd Trimester precautions  PTL precautions  Fetal kick counts RX: no new RX provided  Return to MAU as needed FU with OB as planned   Thressa Sheller DNP, CNM  05/07/21  1:32 PM

## 2021-05-13 ENCOUNTER — Encounter: Payer: Self-pay | Admitting: Diagnostic Neuroimaging

## 2021-05-13 ENCOUNTER — Ambulatory Visit: Payer: BC Managed Care – PPO | Admitting: Diagnostic Neuroimaging

## 2021-05-13 VITALS — BP 116/74 | HR 115 | Ht 63.5 in | Wt 237.0 lb

## 2021-05-13 DIAGNOSIS — G932 Benign intracranial hypertension: Secondary | ICD-10-CM | POA: Diagnosis not present

## 2021-05-13 NOTE — Progress Notes (Signed)
[facility]  PATIENT: Tracy Keller DOB: 02/22/1996  REFERRING CLINICIAN: Nigel Bridgeman, CNM HISTORY FROM: patient  REASON FOR VISIT: follow up   HISTORICAL  CHIEF COMPLAINT:  Chief Complaint  Patient presents with   Follow-up    Rm 6 with mom, reports she has been doing well.     HISTORY OF PRESENT ILLNESS:   UPDATE (05/13/21, VRP): Since last visit, doing well. Currently [redacted] weeks EGA. Due date 06/28/21. HA are improved. Optic nerve edema is improved.   UPDATE (02/11/21, VRP): Since last visit, doing patient went to emergency room and hospital for evaluation.  LP confirmed elevated opening pressure 41 cmH2O.  Symptoms gradually improved.  Currently patient still having some mild headaches but manageable.  Her vision problems have improved.   PRIOR HPI (01/30/21): 25 year old G1P0 female, currently [redacted] weeks pregnant, here for evaluation headaches, numbness, papilledema and vision problems.  Symptoms suddenly started on 01/22/21 with headaches. Then on 01/26/2021 had with right sided numbness and blurred vision.  She also noticed horizontal double vision with both eyes open.  In the morning when she wakes up she cannot see out of her right eye but this gradually improves.  She has been having global headaches as well.  Patient went to OB/GYN clinic for evaluation on 01/26/21, and was recommended to go to the emergency room, but when she got there the wait was too long and so she went home. She also went to optometrist on 01/28/2021, who detected papilledema on the right side more than the left.  Patient was also referred here urgently for evaluation.  Currently patient is having blurred vision, double vision, headaches.  No ringing in ears.  Last year patient had an episode of headaches associated with numbness on the left side.  EMS came to her home, but symptoms spontaneously resolved and she did not seek further medical attention for this.   REVIEW OF SYSTEMS: Full 14  system review of systems performed and negative with exception of: As per HPI.  ALLERGIES: Allergies  Allergen Reactions   Other     seasonal allergies    HOME MEDICATIONS: Outpatient Medications Prior to Visit  Medication Sig Dispense Refill   Blood Pressure Monitoring (BLOOD PRESSURE CUFF) MISC For home blood pressure monitoring during pregnancy 1 each 0   ondansetron (ZOFRAN ODT) 4 MG disintegrating tablet Take 1 tablet (4 mg total) by mouth every 6 (six) hours as needed for nausea. 20 tablet 0   Prenatal MV & Min w/FA-DHA (PRENATAL ADULT GUMMY/DHA/FA PO) Take 2 tablets by mouth at bedtime.     acetaminophen (TYLENOL) 500 MG tablet Take 1,000 mg by mouth every 6 (six) hours as needed for moderate pain or headache. (Patient not taking: Reported on 04/21/2021)     No facility-administered medications prior to visit.    PAST MEDICAL HISTORY: Past Medical History:  Diagnosis Date   BMI 35.0-35.9,adult    Intracranial hypertension    Medical history non-contributory     PAST SURGICAL HISTORY: Past Surgical History:  Procedure Laterality Date   WISDOM TOOTH EXTRACTION      FAMILY HISTORY: Family History  Problem Relation Age of Onset   Pseudotumor cerebri Neg Hx     SOCIAL HISTORY: Social History   Socioeconomic History   Marital status: Single    Spouse name: Not on file   Number of children: Not on file   Years of education: Not on file   Highest education level: Not on file  Occupational  History   Not on file  Tobacco Use   Smoking status: Never   Smokeless tobacco: Never  Vaping Use   Vaping Use: Never used  Substance and Sexual Activity   Alcohol use: Never   Drug use: Never   Sexual activity: Yes  Other Topics Concern   Not on file  Social History Narrative   Right handed   Coffee/iced tea sometimes   Lives with boyfriend   Social Determinants of Health   Financial Resource Strain: Not on file  Food Insecurity: Not on file  Transportation  Needs: Not on file  Physical Activity: Not on file  Stress: Not on file  Social Connections: Not on file  Intimate Partner Violence: Not on file     PHYSICAL EXAM  GENERAL EXAM/CONSTITUTIONAL: Vitals:  Vitals:   05/13/21 1451  BP: 116/74  Pulse: (!) 115  SpO2: 97%  Weight: 237 lb (107.5 kg)  Height: 5' 3.5" (1.613 m)   Body mass index is 41.32 kg/m. Wt Readings from Last 3 Encounters:  05/13/21 237 lb (107.5 kg)  05/07/21 235 lb (106.6 kg)  02/14/21 225 lb (102.1 kg)   Patient is in no distress; well developed, nourished and groomed; neck is supple  CARDIOVASCULAR: Examination of carotid arteries is normal; no carotid bruits Regular rate and rhythm, no murmurs Examination of peripheral vascular system by observation and palpation is normal  EYES: No results found.   MUSCULOSKELETAL: Gait, strength, tone, movements noted in Neurologic exam below  NEUROLOGIC: MENTAL STATUS:  No flowsheet data found. awake, alert, oriented to person, place and time recent and remote memory intact normal attention and concentration language fluent, comprehension intact, naming intact fund of knowledge appropriate  CRANIAL NERVE:  2nd - MILD PAPILLEDEMA BILATERAL 2nd, 3rd, 4th, 6th - pupils equal and reactive to light, visual fields full to confrontation, extraocular muscles full; no nystagmus 5th - facial sensation symmetric 7th - facial strength symmetric 8th - hearing intact 9th - palate elevates symmetrically, uvula midline 11th - shoulder shrug symmetric 12th - tongue protrusion midline  MOTOR:  normal bulk and tone, full strength in the BUE, BLE  SENSORY:  normal and symmetric to light touch, temperature, vibration  COORDINATION:  finger-nose-finger, fine finger movements normal  REFLEXES:  deep tendon reflexes 1+ and symmetric  GAIT/STATION:  narrow based gait     DIAGNOSTIC DATA (LABS, IMAGING, TESTING) - I reviewed patient records, labs, notes,  testing and imaging myself where available.  Lab Results  Component Value Date   WBC 12.4 (H) 02/01/2021   HGB 10.4 (L) 02/01/2021   HCT 33.4 (L) 02/01/2021   MCV 82.5 02/01/2021   PLT 238 02/01/2021      Component Value Date/Time   NA 136 02/01/2021 0213   K 3.8 02/01/2021 0213   CL 106 02/01/2021 0213   CO2 24 02/01/2021 0213   GLUCOSE 96 02/01/2021 0213   BUN 7 02/01/2021 0213   CREATININE 0.57 02/01/2021 0213   CALCIUM 9.3 02/01/2021 0213   PROT 7.1 01/30/2021 1610   ALBUMIN 3.4 (L) 01/30/2021 1610   AST 20 01/30/2021 1610   ALT 24 01/30/2021 1610   ALKPHOS 70 01/30/2021 1610   BILITOT 0.2 (L) 01/30/2021 1610   GFRNONAA >60 02/01/2021 0213   No results found for: CHOL, HDL, LDLCALC, LDLDIRECT, TRIG, CHOLHDL No results found for: ACZY6A No results found for: VITAMINB12 No results found for: TSH  01/30/21 MRI brain / MRV head 1. Normal MRI of the brain. 2. Focal  narrowing at the junction of the transverse and sigmoid sinuses bilaterally, possibly arachnoid granulations. MRV with contrast or CT venogram is recommended.    01/31/21 LP via IR  PROCEDURE: Standard time-out was employed. I discussed the risks (including hemorrhage, infection, headache, and nerve damage, among others), benefits, and alternatives to fluoroscopically guided lumbar puncture with the patient. We specifically discussed the high technical likelihood of success of the procedure and the low risks of low-dose radiation in the 2nd trimester of pregnancy. The patient understood and elected to undergo the procedure.   An appropriate site for lumbar puncture was marked on the patient's skin under fluoroscopic guidance. Following sterile skin prep and local anesthetic administration consisting of 1 percent lidocaine, a 22 gauge spinal needle was advanced without difficulty into the thecal sac at the at the L2-3 level under fluoroscopic guidance. Clear CSF was returned. Opening pressure was elevated  at 41 cm water, measured prone through the needle.   ASSESSMENT AND PLAN  25 y.o. year old female here with new onset of headaches, right-sided numbness, papilledema, double vision, transient visual obscuration, since 01/26/2021.    Dx:  No diagnosis found.     PLAN:  idiopathic intracranial hypertension (pseudotumor cerebri) --> NEW ONSET HEADACHES, RIGHT SIDED NUMBNESS, PAPILLEDEMA, DOUBLE VISION, TRANSIENT VISUAL OBSCURATION (? Bilateral CN6 palsies) since 01/26/21; now improved since Oct 2022.  - follow up eye exam (Dr. Genia Del)  - if sxs sig worsen, then may consider optic nerve fenestration, VP shunt or meds (acetazolamide or topiramate); caution with pregnancy state  - may consider follow up LP in future   Return in about 4 months (around 09/11/2021).    Suanne Marker, MD 05/13/2021, 2:58 PM Certified in Neurology, Neurophysiology and Neuroimaging  [facility] [address] [place], [place] [address] [phone]

## 2021-05-19 ENCOUNTER — Ambulatory Visit: Payer: BC Managed Care – PPO | Admitting: *Deleted

## 2021-05-19 ENCOUNTER — Other Ambulatory Visit: Payer: Self-pay

## 2021-05-19 ENCOUNTER — Ambulatory Visit: Payer: BC Managed Care – PPO | Attending: Maternal & Fetal Medicine

## 2021-05-19 VITALS — BP 122/74 | HR 100

## 2021-05-19 DIAGNOSIS — R638 Other symptoms and signs concerning food and fluid intake: Secondary | ICD-10-CM

## 2021-05-19 DIAGNOSIS — G932 Benign intracranial hypertension: Secondary | ICD-10-CM | POA: Insufficient documentation

## 2021-05-19 DIAGNOSIS — Z3A34 34 weeks gestation of pregnancy: Secondary | ICD-10-CM

## 2021-05-19 DIAGNOSIS — E669 Obesity, unspecified: Secondary | ICD-10-CM

## 2021-05-19 DIAGNOSIS — Z3689 Encounter for other specified antenatal screening: Secondary | ICD-10-CM | POA: Diagnosis present

## 2021-05-19 DIAGNOSIS — O99213 Obesity complicating pregnancy, third trimester: Secondary | ICD-10-CM

## 2021-05-19 IMAGING — US US MFM OB FOLLOW-UP
1 series · 14 of 21 positions shown · non-contrast
Comparison: none

[Series 1: us mfm ob follow-up · 21 acquisitions, 14 frames shown]
[im 1/21]
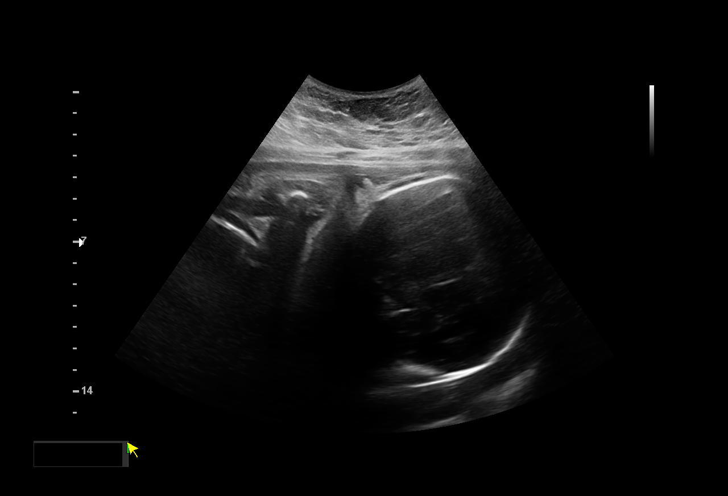
[im 3/21]
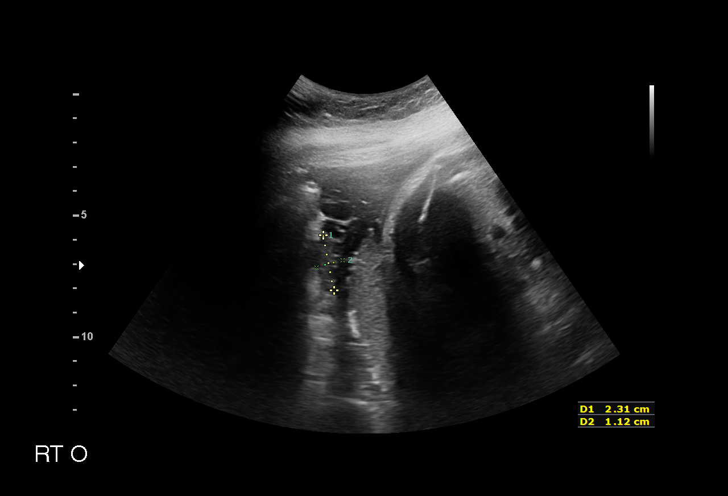
[im 4/21]
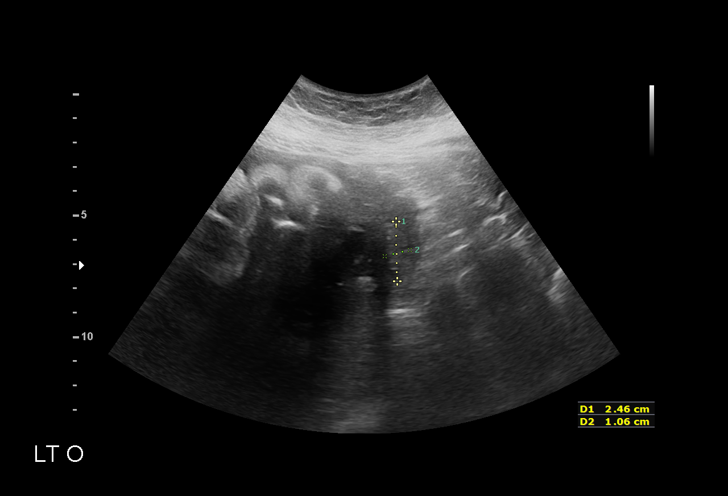
[im 6/21]
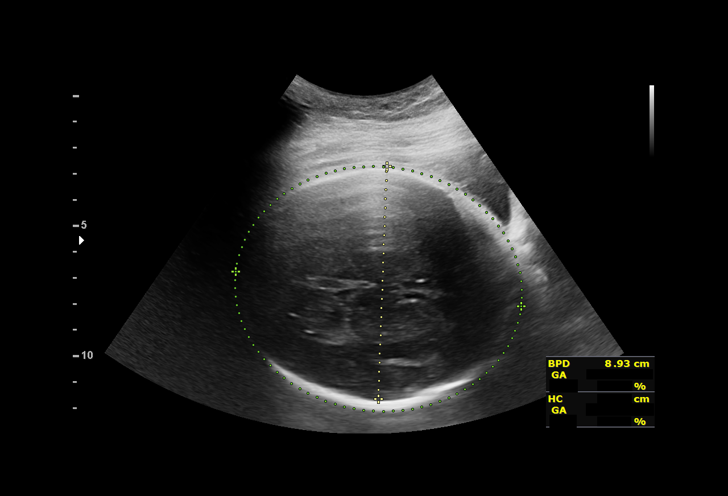
[im 7/21]
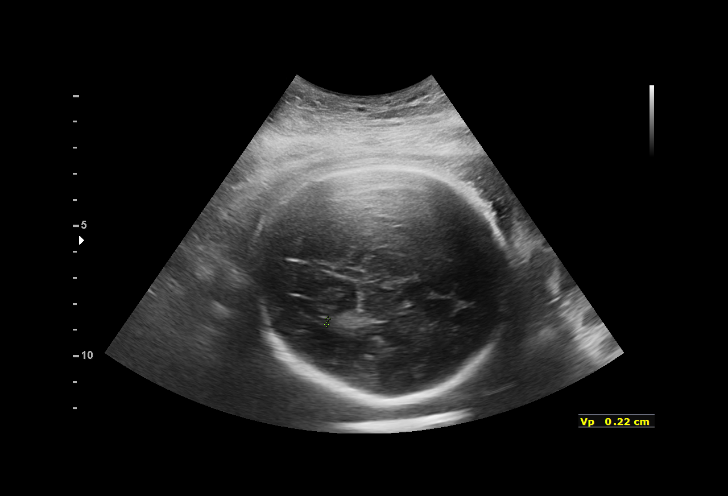
[im 9/21]
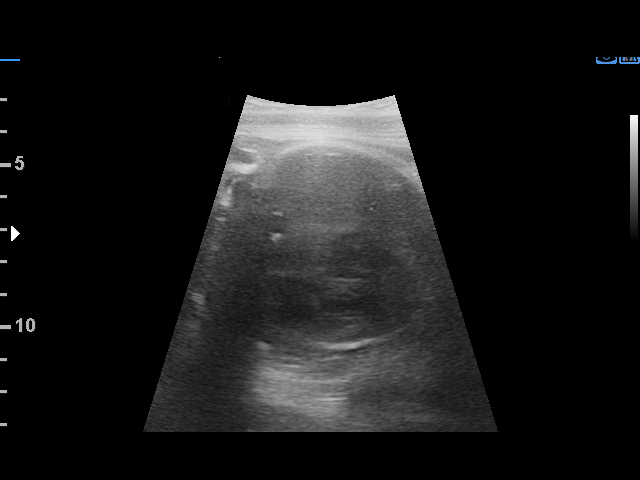
[im 10/21]
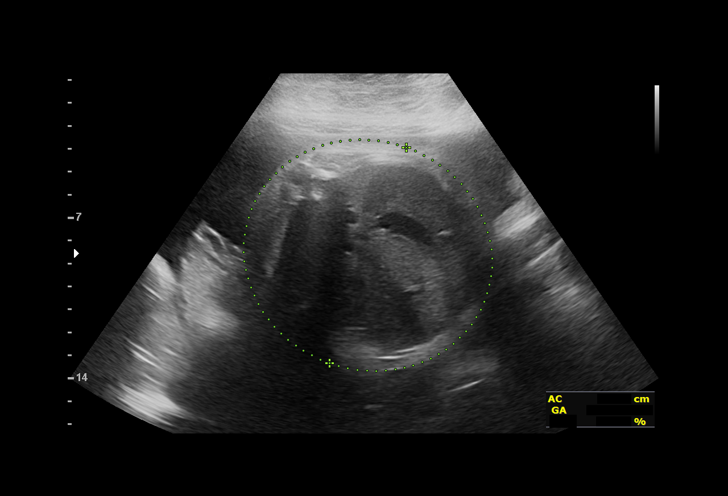
[im 12/21]
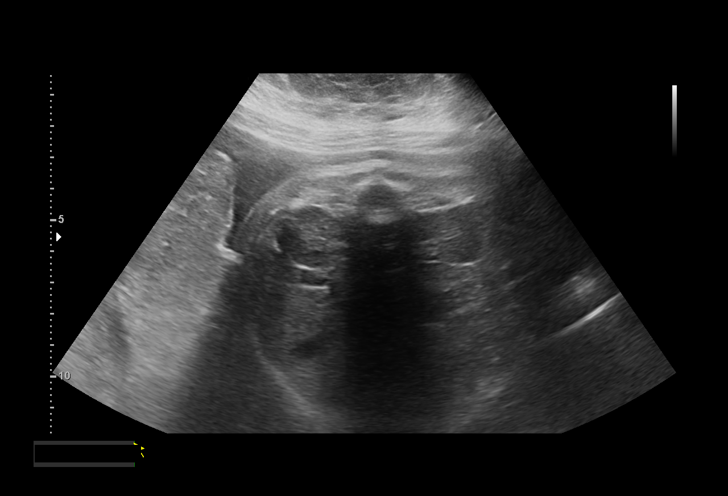
[im 13/21]
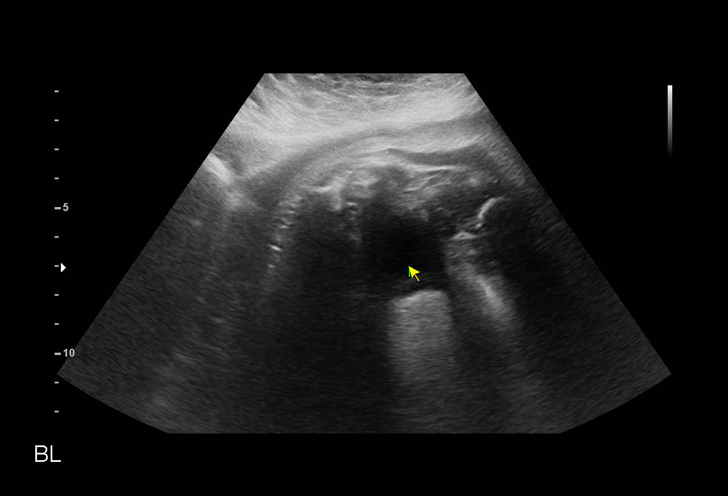
[im 15/21]
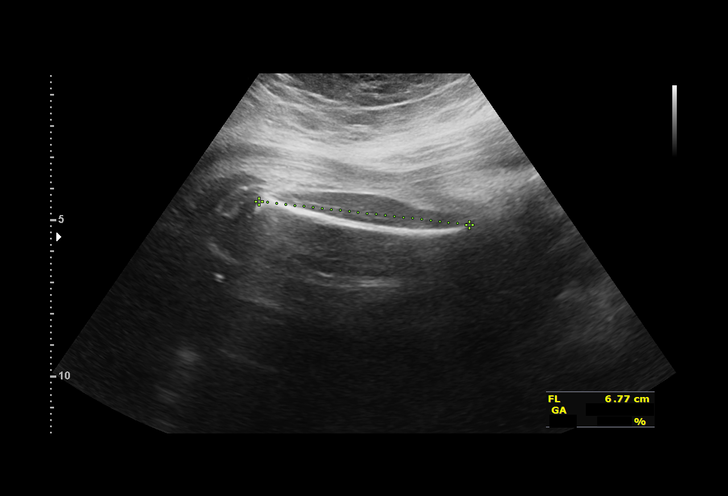
[im 16/21]
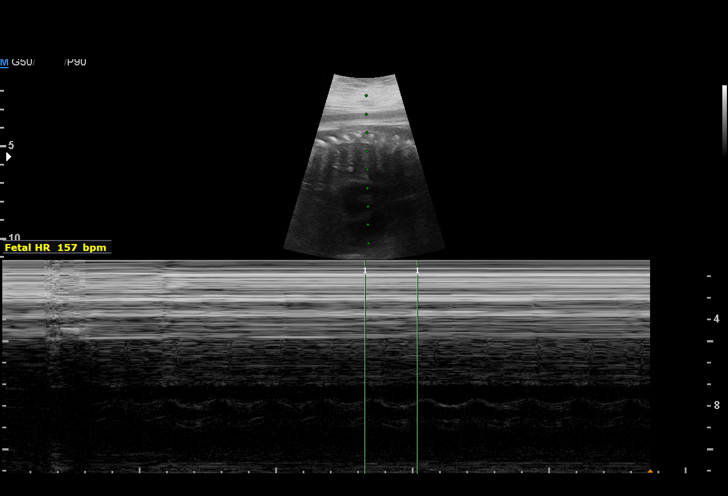
[im 18/21]
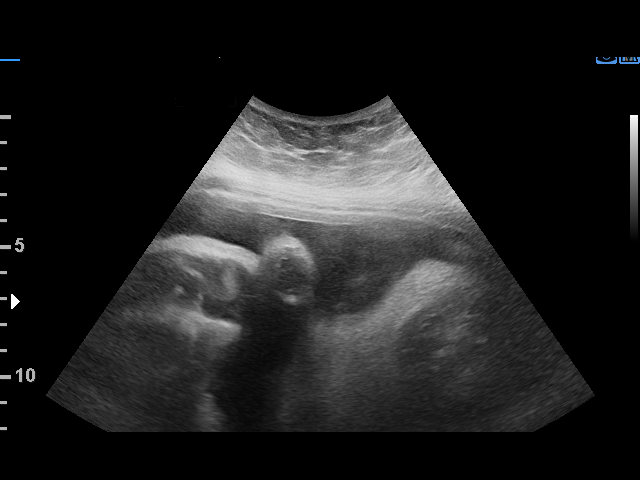
[im 19/21]
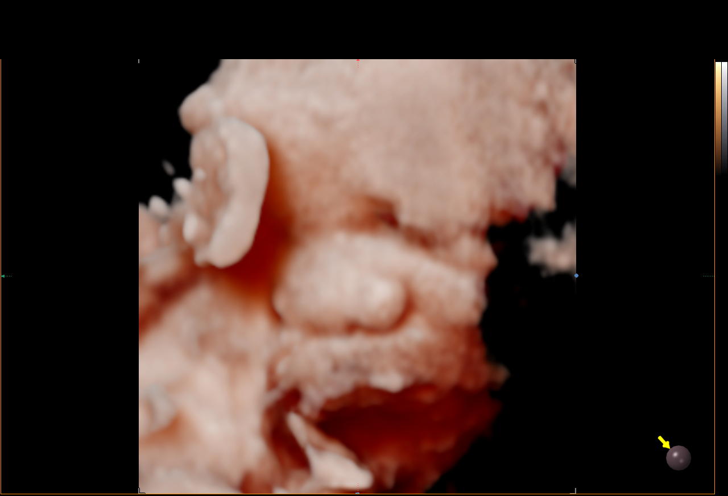
[im 21/21]
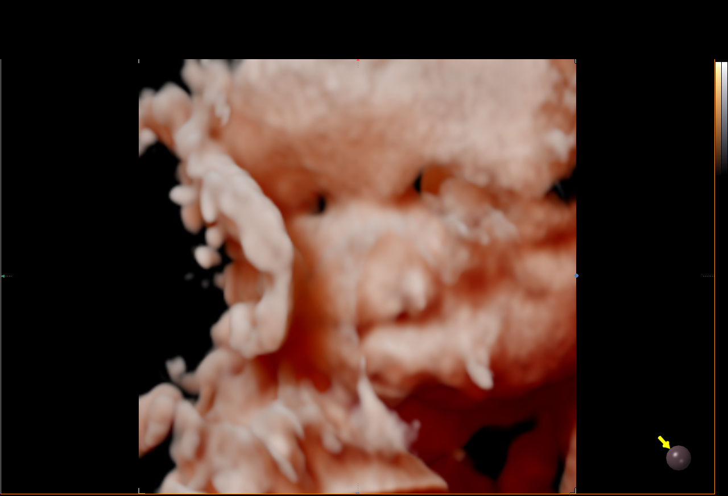

[14 of 21 positions shown; findings below may reference images not displayed]

Obstetrics &
                                                            Gynecology
                                                            [T1] AMSTRON
                                                            AMSTRON.
                   CNM

                                                      AMSTRON

Indications

 Obesity complicating pregnancy, third          [T1]
 trimester BMI 40
 Low lying placenta, antepartum (Resolved)      [T1]
 Medical complication of pregnancy (IIH -       [T1]
 new dx)
 Encounter for other antenatal screening        [T1]
 follow-up
 34 weeks gestation of pregnancy
Fetal Evaluation

 Num Of Fetuses:         1
 Fetal Heart Rate(bpm):  157
 Cardiac Activity:       Observed
 Presentation:           Cephalic
 Placenta:               Posterior
 P. Cord Insertion:      Previously Visualized
 Amniotic Fluid
 AFI FV:      Within normal limits

 AFI Sum(cm)     %Tile       Largest Pocket(cm)
 16.04           58

 RUQ(cm)       RLQ(cm)       LUQ(cm)        LLQ(cm)

Biophysical Evaluation

 Amniotic F.V:   Within normal limits       F. Tone:        Observed
 F. Movement:    Observed                   Score:          [DATE]
 F. Breathing:   Observed
Biometry

 BPD:      89.4  mm     G. Age:  36w 1d         92  %    CI:        78.83   %    70 - 86
                                                         FL/HC:      21.3   %    19.4 -
 HC:      318.4  mm     G. Age:  35w 6d         54  %    HC/AC:      0.98        0.96 -
 AC:      323.3  mm     G. Age:  36w 2d         95  %    FL/BPD:     75.7   %    71 - 87
 FL:       67.7  mm     G. Age:  34w 6d         55  %    FL/AC:      20.9   %    20 - 24
 LV:        2.2  mm

 Est. FW:    [T1]  gm      6 lb 2 oz     86  %
Gestational Age

 LMP:           34w 2d        Date:  [DATE]                 EDD:   [DATE]
 U/S Today:     35w 6d                                        EDD:   [DATE]
 Best:          34w 2d     Det. By:  LMP  ([DATE])          EDD:   [DATE]
Anatomy

 Cranium:               Appears normal         Aortic Arch:            Previously seen
 Cavum:                 Previously seen        Ductal Arch:            Previously seen
 Ventricles:            Appears normal         Diaphragm:              Previously seen
 Choroid Plexus:        Previously seen        Stomach:                Appears normal, left
                                                                       sided
 Cerebellum:            Previously seen        Abdomen:                Previously seen
 Posterior Fossa:       Previously seen        Abdominal Wall:         Previously seen
 Nuchal Fold:           Not applicable (>20    Cord Vessels:           Previously seen
                        wks GA)
 Face:                  Orbits and profile     Kidneys:                Appear normal
                        previously seen
 Lips:                  Previously seen        Bladder:                Appears normal
 Thoracic:              Previously seen        Spine:                  Previously seen
 Heart:                 Appears normal         Upper Extremities:      Previously seen
                        (4CH, axis, and
                        situs)
 RVOT:                  Previously seen        Lower Extremities:      Previously seen
 LVOT:                  Previously seen

 Other:  Technically difficult due to maternal habitus.
Cervix Uterus Adnexa
 Cervix
 Not visualized (advanced GA >[T1])

 Right Ovary
 Visualized.

 Left Ovary
 Visualized.
Impression

 Follow up growth due to elevated BMI
 Normal interval growth with measurements consistent with
 dates
 Good fetal movement and amniotic fluid volume
 Biophysical profile [DATE]
Recommendations

 Initiate weekly testing between 34-36 weeks.

## 2021-05-26 ENCOUNTER — Other Ambulatory Visit: Payer: Self-pay

## 2021-05-26 ENCOUNTER — Other Ambulatory Visit: Payer: Self-pay | Admitting: *Deleted

## 2021-05-26 ENCOUNTER — Ambulatory Visit: Payer: BC Managed Care – PPO | Attending: Maternal & Fetal Medicine

## 2021-05-26 ENCOUNTER — Ambulatory Visit: Payer: BC Managed Care – PPO | Admitting: *Deleted

## 2021-05-26 VITALS — BP 129/57 | HR 95

## 2021-05-26 DIAGNOSIS — O99213 Obesity complicating pregnancy, third trimester: Secondary | ICD-10-CM

## 2021-05-26 DIAGNOSIS — G932 Benign intracranial hypertension: Secondary | ICD-10-CM | POA: Diagnosis present

## 2021-05-26 DIAGNOSIS — R638 Other symptoms and signs concerning food and fluid intake: Secondary | ICD-10-CM | POA: Diagnosis present

## 2021-05-26 DIAGNOSIS — Z3689 Encounter for other specified antenatal screening: Secondary | ICD-10-CM | POA: Insufficient documentation

## 2021-05-26 DIAGNOSIS — E669 Obesity, unspecified: Secondary | ICD-10-CM | POA: Diagnosis not present

## 2021-05-26 DIAGNOSIS — Z3A35 35 weeks gestation of pregnancy: Secondary | ICD-10-CM

## 2021-05-26 DIAGNOSIS — Z6841 Body Mass Index (BMI) 40.0 and over, adult: Secondary | ICD-10-CM

## 2021-05-31 NOTE — L&D Delivery Note (Signed)
Vaginal Delivery Note - Shoulder  Patient pushed for 2 hours after she was noted to be C/C/+1. Guided pushing with maternal urge and regular contractions.  At 6:43 PM  a delivery of a viable  female infant was delivered over intact perineum via  (Presentation: LOA restituted to ROA). After head was delivered the Anterior shoulder did not immediately deliver with gentle downward traction and a shoulder dystocia was called. Emergency team arrived with Peds.   Simultaneous mcRoberts / suprapubic pressure was performed then Wood's Screw maneuver. The Shoulder was then delivered followed by the body w/o difficulty and the Baby placed on the maternal abdomen. The cord was quickly double clamped and cut and the infant was examined by Peds. The baby was noted to be moving all four extremities and there  was no notable defect after the shoulder dystocia. Venous cord gas sample collected followed by cord blood. Placenta spontaneously delivered intact with trailing membranes,   Uterine atony alleviated by IV pitocin and massage and a dose of Tranexamic acid.   Upon inspection of the cervix, vagina and perineum a periurethral and second degree vaginal degree laceration repaired in routine fashion with 2-0 vicryl and 3-0 chromic.  Patient tolerated delivery well, there were no complications.    Delivery Details: Delivery Type: NSVD  Anesthesia Epidural  Episiotomy:  None  Lacerations:  Periurethral and second degree vaginal  Repair suture:  2-0 vicryl and 3-0 chromic  Blood loss (ml):  300  Shoulder Dystocia Delivery of the head: 06/25/2021  6:42 PM First maneuver: 06/25/2021  6:42 PM, Suprapubic Pressure Second maneuver: 06/25/2021  6:43 PM, McRoberts Third maneuver: 06/25/2021  6:43 PM,  Burton Apley    Birth information: Date of birth:   [birth year]   Time of birth:    18:43  Sex: female    Name:  "River"  APGAR APGAR (1 MIN): 4   APGAR (5 MINS): 8   APGAR (10 MINS):    Weight  8 lbs 3.9 oz (3740 grams)    Resuscitation:          Drying, stimulation, CPAP  Cord information:    Blood gases sent?  Y Complications:         Shoulder dystocia  Placenta: Delivered:   intact, 3 VC    appearance: normal   Disposition: Mom to postpartum.  Baby to NICU.  Essie Hart MD 06/25/2021, 7:27 PM

## 2021-06-02 ENCOUNTER — Ambulatory Visit: Payer: Medicaid Other | Attending: Maternal & Fetal Medicine

## 2021-06-02 ENCOUNTER — Other Ambulatory Visit: Payer: Self-pay

## 2021-06-02 ENCOUNTER — Encounter: Payer: Self-pay | Admitting: *Deleted

## 2021-06-02 ENCOUNTER — Ambulatory Visit: Payer: Medicaid Other | Admitting: *Deleted

## 2021-06-02 VITALS — BP 123/67 | HR 96

## 2021-06-02 DIAGNOSIS — E669 Obesity, unspecified: Secondary | ICD-10-CM

## 2021-06-02 DIAGNOSIS — Z3689 Encounter for other specified antenatal screening: Secondary | ICD-10-CM | POA: Diagnosis present

## 2021-06-02 DIAGNOSIS — G932 Benign intracranial hypertension: Secondary | ICD-10-CM | POA: Insufficient documentation

## 2021-06-02 DIAGNOSIS — R638 Other symptoms and signs concerning food and fluid intake: Secondary | ICD-10-CM | POA: Diagnosis not present

## 2021-06-02 DIAGNOSIS — Z6841 Body Mass Index (BMI) 40.0 and over, adult: Secondary | ICD-10-CM | POA: Diagnosis present

## 2021-06-02 DIAGNOSIS — Z3A36 36 weeks gestation of pregnancy: Secondary | ICD-10-CM

## 2021-06-02 DIAGNOSIS — O99213 Obesity complicating pregnancy, third trimester: Secondary | ICD-10-CM

## 2021-06-02 IMAGING — US US MFM FETAL BPP W/O NON-STRESS
1 series · 15 of 18 positions shown · non-contrast
Comparison: none

[Series 1: us mfm fetal bpp w/o non-stress · 18 acquisitions, 15 frames shown]
[im 1/18]
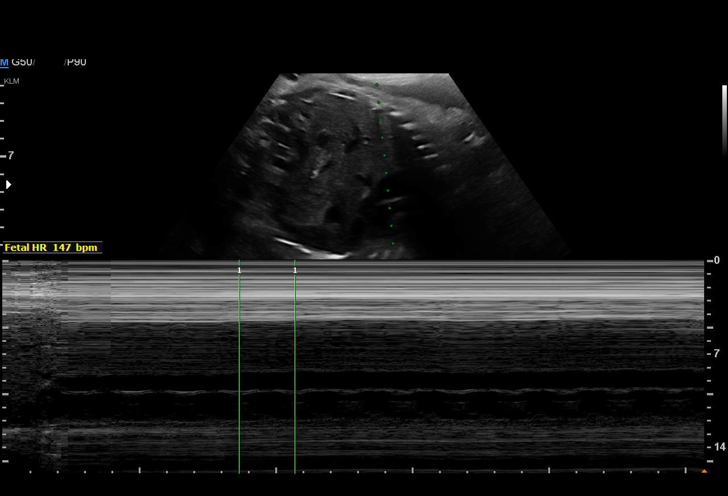
[im 2/18]
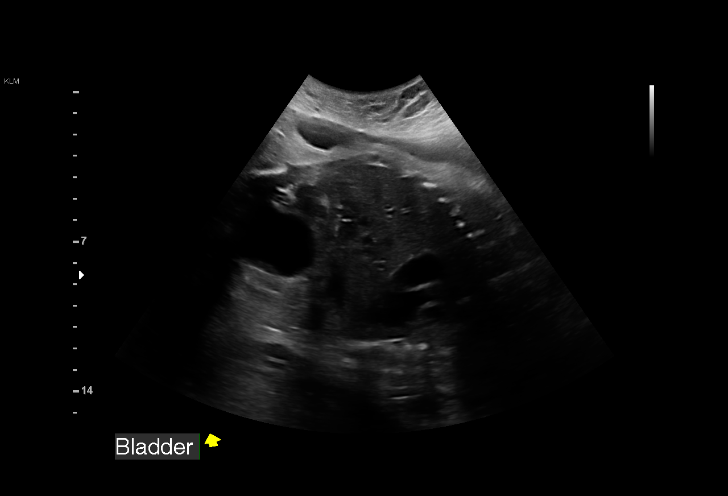
[im 4/18]
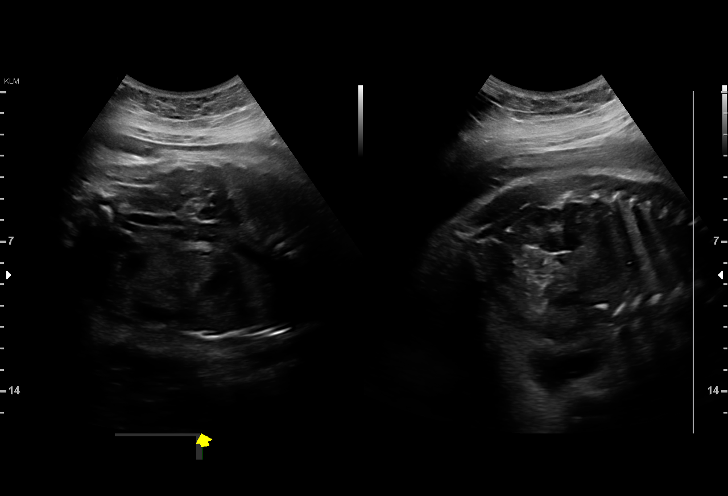
[im 5/18]
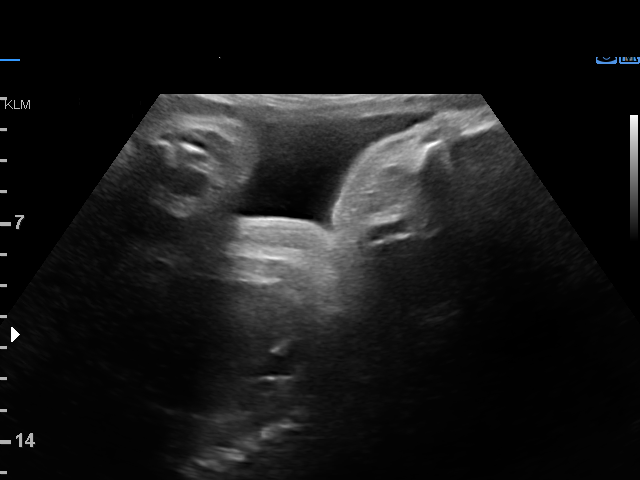
[im 6/18]
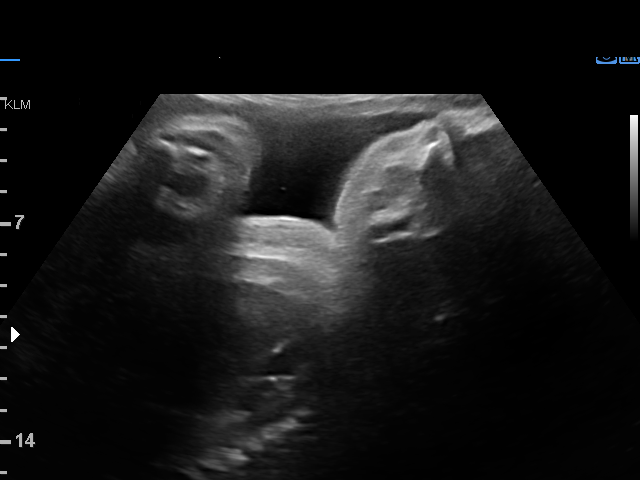
[im 7/18]
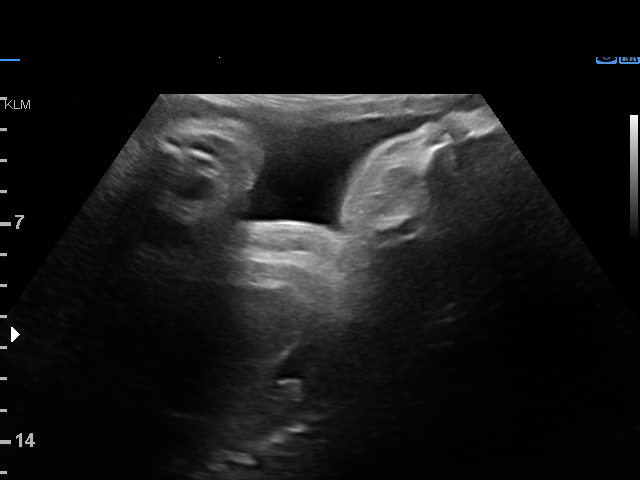
[im 8/18]
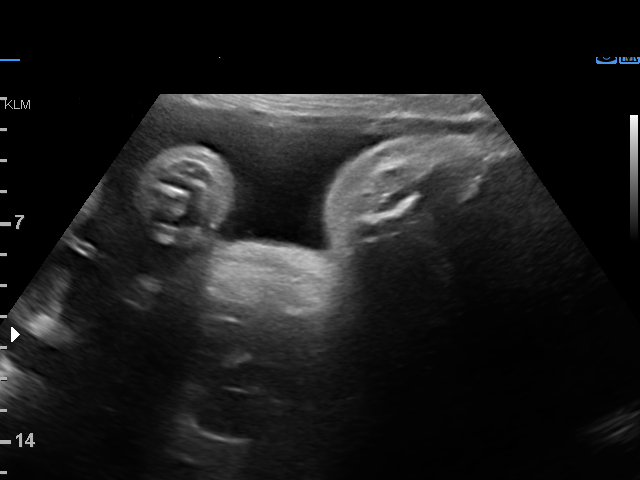
[im 10/18]
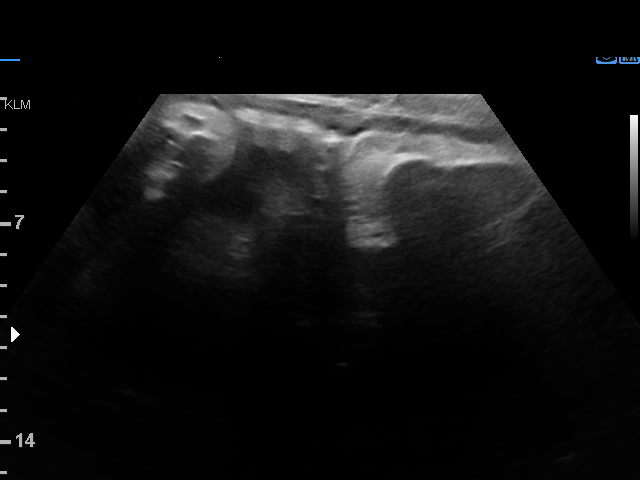
[im 11/18]
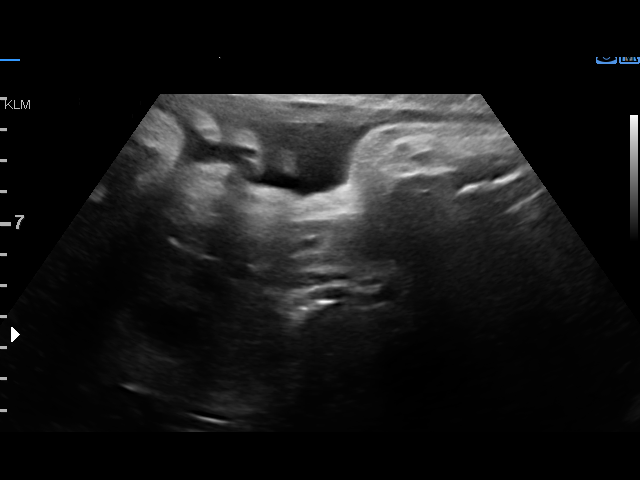
[im 12/18]
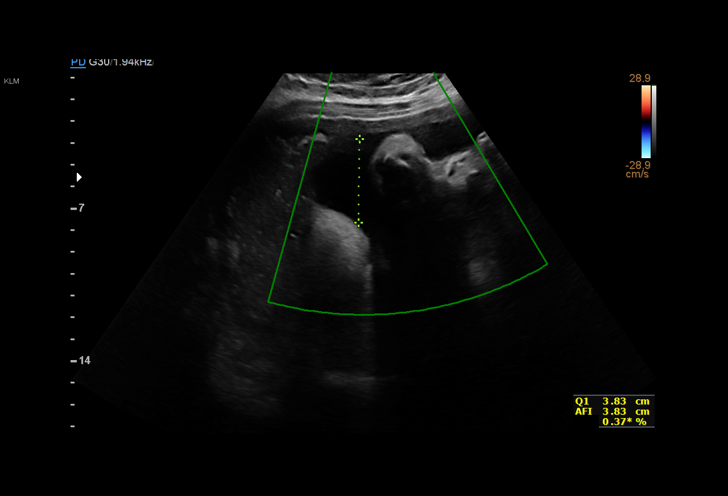
[im 13/18]
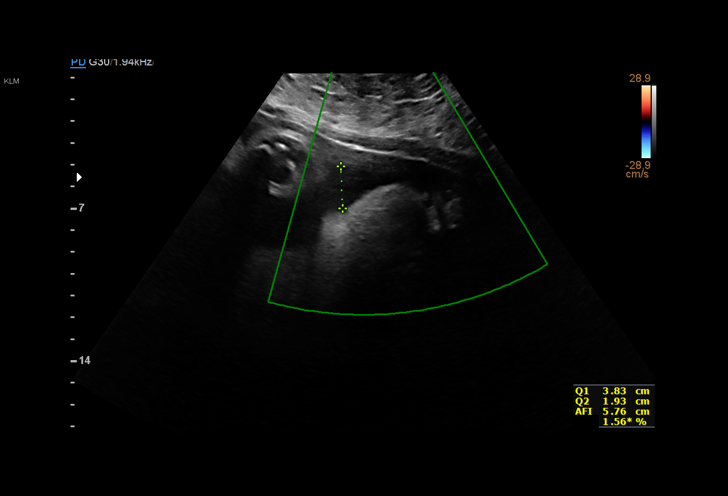
[im 14/18]
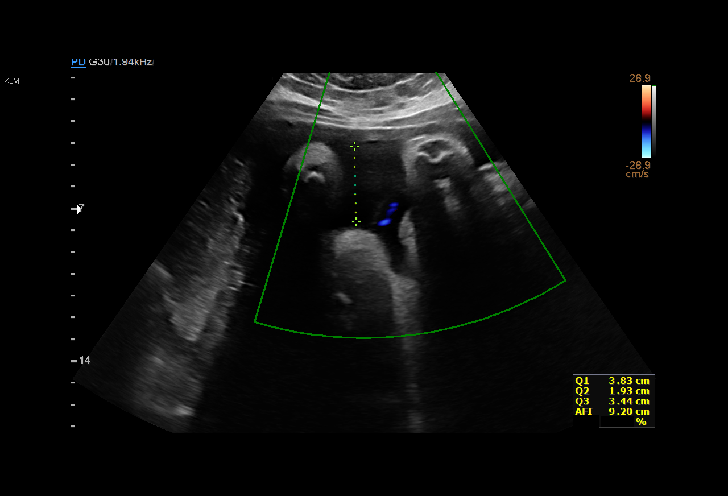
[im 16/18]
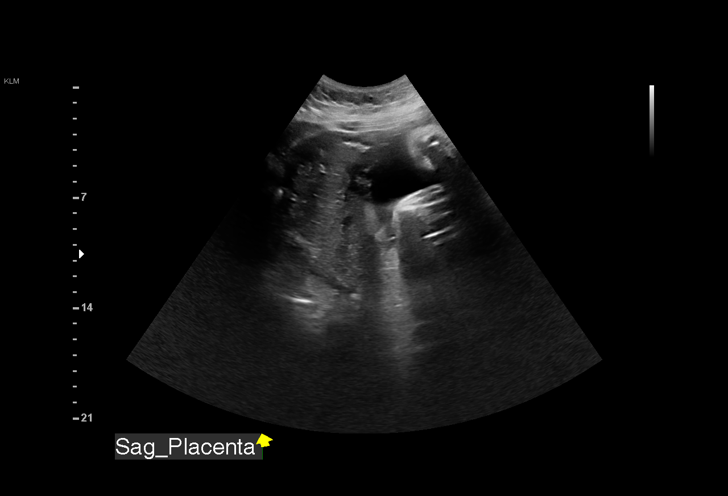
[im 17/18]
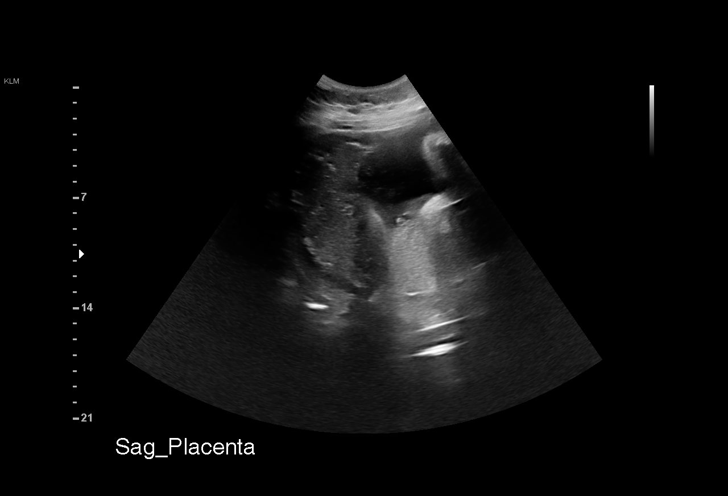
[im 18/18]
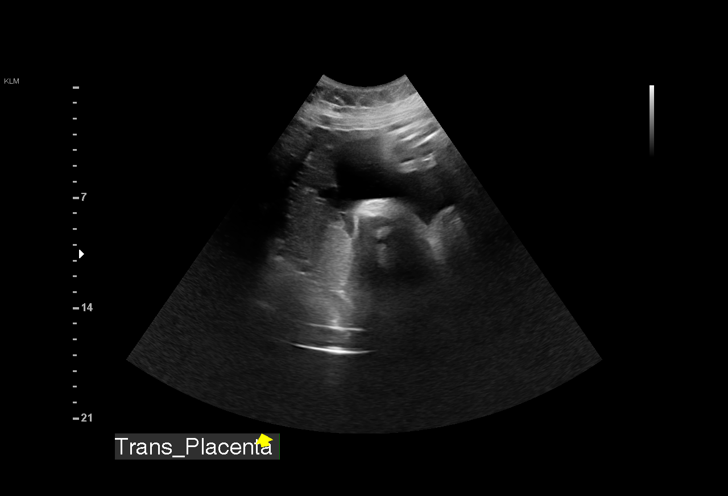

[15 of 18 positions shown; findings below may reference images not displayed]

Obstetrics &
                                                            Gynecology
                                                            [E4] ERXLEBEN
                                                            ERXLEBEN.
                   CNM

Indications

 Obesity complicating pregnancy, third          [E4]
 trimester BMI 40
 36 weeks gestation of pregnancy
 Low lying placenta, antepartum (Resolved)      [E4]
 Medical complication of pregnancy (IIH -       [E4]
 new dx)
Fetal Evaluation

 Num Of Fetuses:         1
 Fetal Heart Rate(bpm):  147
 Cardiac Activity:       Observed
 Presentation:           Cephalic
 Placenta:               Posterior
 P. Cord Insertion:      Previously Visualized

 Amniotic Fluid
 AFI FV:      Within normal limits

 AFI Sum(cm)     %Tile       Largest Pocket(cm)
 11.34           32

 RUQ(cm)       RLQ(cm)       LUQ(cm)        LLQ(cm)

Biophysical Evaluation

 Amniotic F.V:   Within normal limits       F. Tone:        Observed
 F. Movement:    Observed                   Score:          [DATE]
 F. Breathing:   Observed
Gestational Age

 LMP:           36w 2d        Date:  [DATE]                 EDD:   [DATE]
 Best:          36w 2d     Det. By:  LMP  ([DATE])          EDD:   [DATE]
Anatomy

 Stomach:               Appears normal, left   Bladder:                Appears normal
                        sided
 Kidneys:               Appear normal
Comments

 This patient was seen for a biophysical profile due to
 maternal obesity with a BMI of 40.  She denies any problems
 since her last exam.
 A biophysical profile performed today was [DATE].
 There was normal amniotic fluid noted on today's ultrasound
 exam.
 She will return in 1 week for another BPP.

## 2021-06-03 LAB — OB RESULTS CONSOLE GBS: GBS: POSITIVE

## 2021-06-09 ENCOUNTER — Other Ambulatory Visit: Payer: Self-pay

## 2021-06-09 ENCOUNTER — Ambulatory Visit: Payer: Medicaid Other | Admitting: *Deleted

## 2021-06-09 ENCOUNTER — Ambulatory Visit: Payer: Medicaid Other | Attending: Maternal & Fetal Medicine

## 2021-06-09 VITALS — BP 129/67 | HR 95

## 2021-06-09 DIAGNOSIS — O99213 Obesity complicating pregnancy, third trimester: Secondary | ICD-10-CM

## 2021-06-09 DIAGNOSIS — Z3A37 37 weeks gestation of pregnancy: Secondary | ICD-10-CM | POA: Diagnosis not present

## 2021-06-09 DIAGNOSIS — O4443 Low lying placenta NOS or without hemorrhage, third trimester: Secondary | ICD-10-CM

## 2021-06-09 DIAGNOSIS — O163 Unspecified maternal hypertension, third trimester: Secondary | ICD-10-CM | POA: Diagnosis not present

## 2021-06-09 DIAGNOSIS — Z6841 Body Mass Index (BMI) 40.0 and over, adult: Secondary | ICD-10-CM

## 2021-06-09 DIAGNOSIS — G932 Benign intracranial hypertension: Secondary | ICD-10-CM | POA: Insufficient documentation

## 2021-06-09 IMAGING — US US MFM FETAL BPP W/O NON-STRESS
1 series · 12 of 25 positions shown · non-contrast
Comparison: none

[Series 1: us mfm fetal bpp w/o non-stress · 25 acquisitions, 12 frames shown]
[im 2/25]
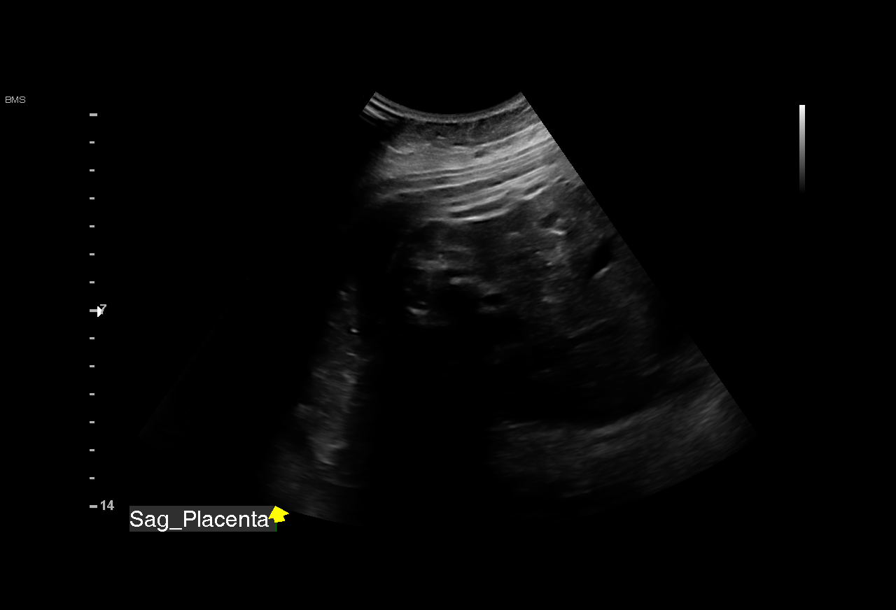
[im 4/25]
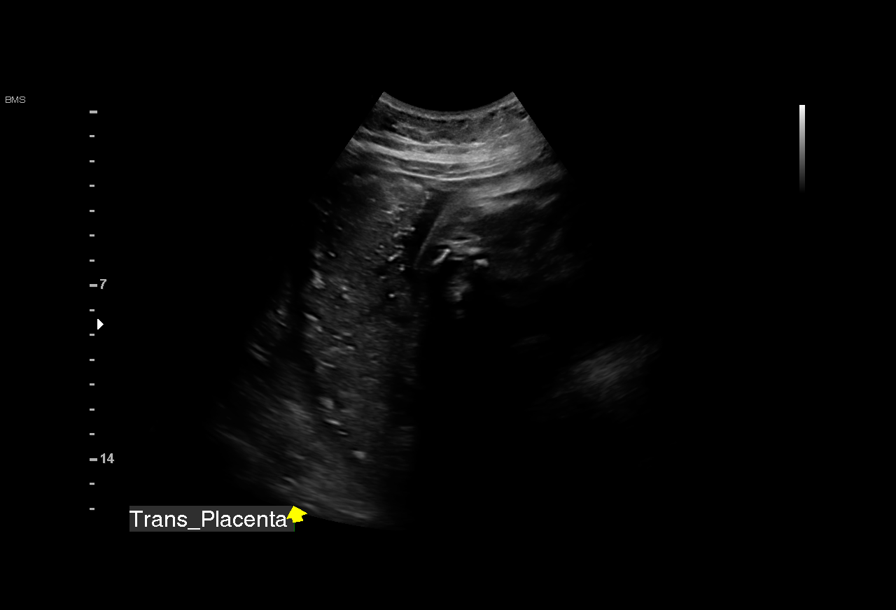
[im 6/25]
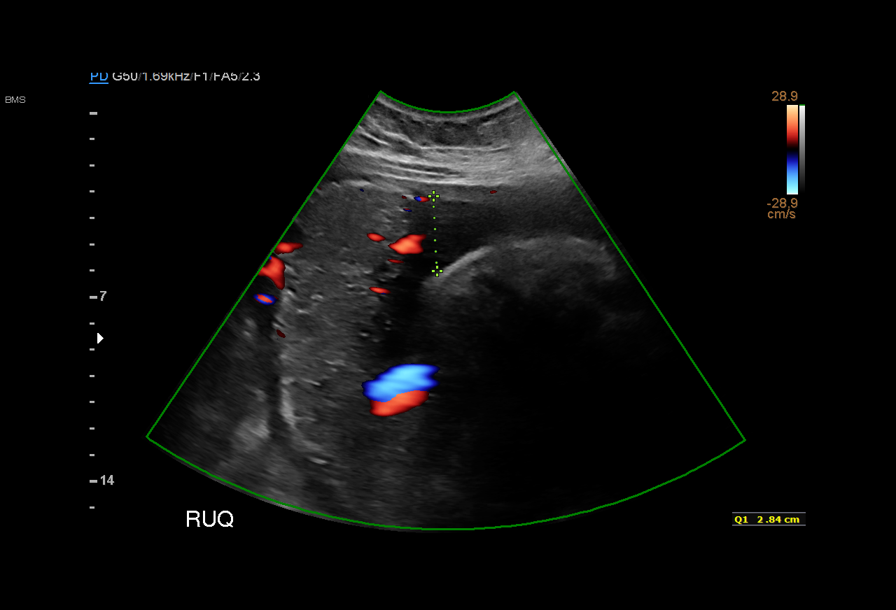
[im 8/25]
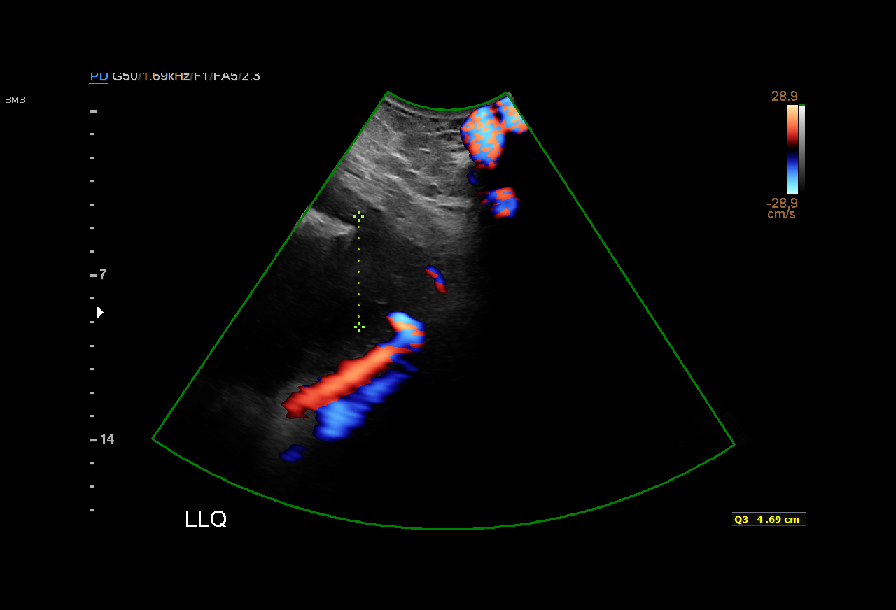
[im 10/25]
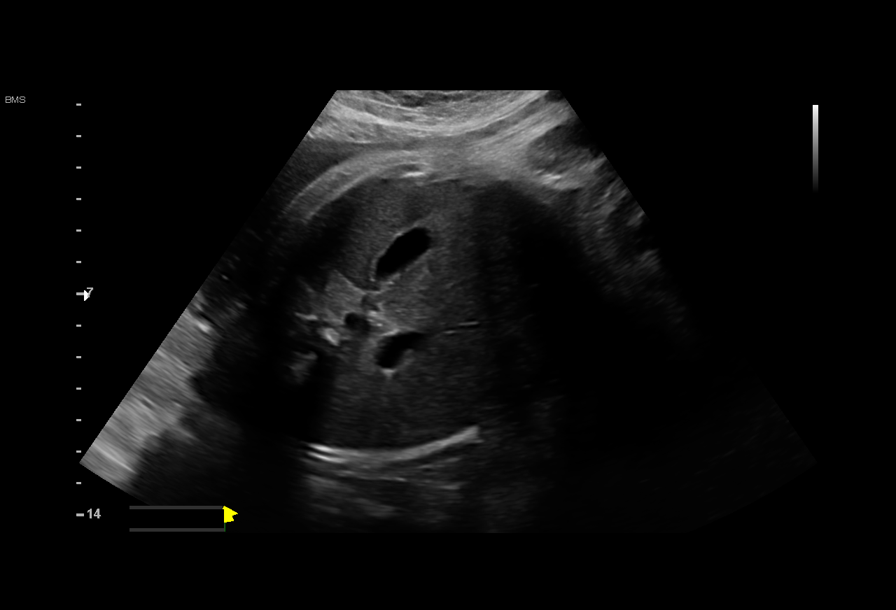
[im 12/25]
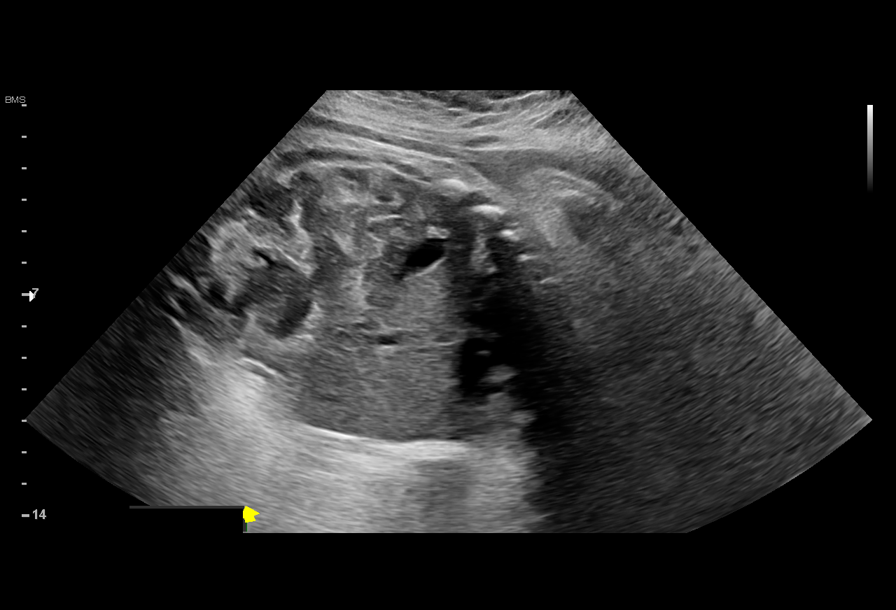
[im 14/25]
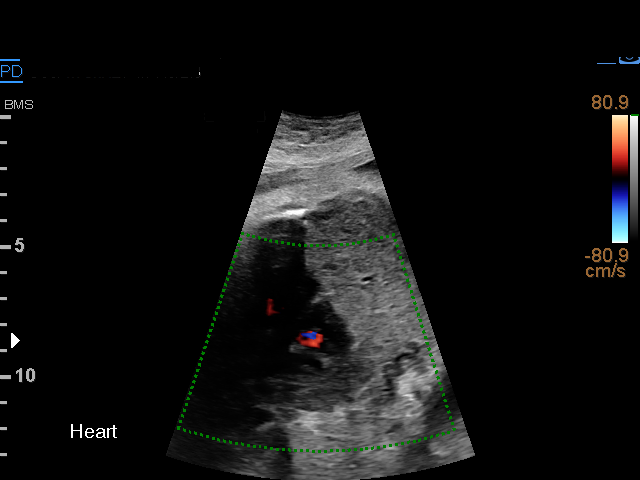
[im 16/25]
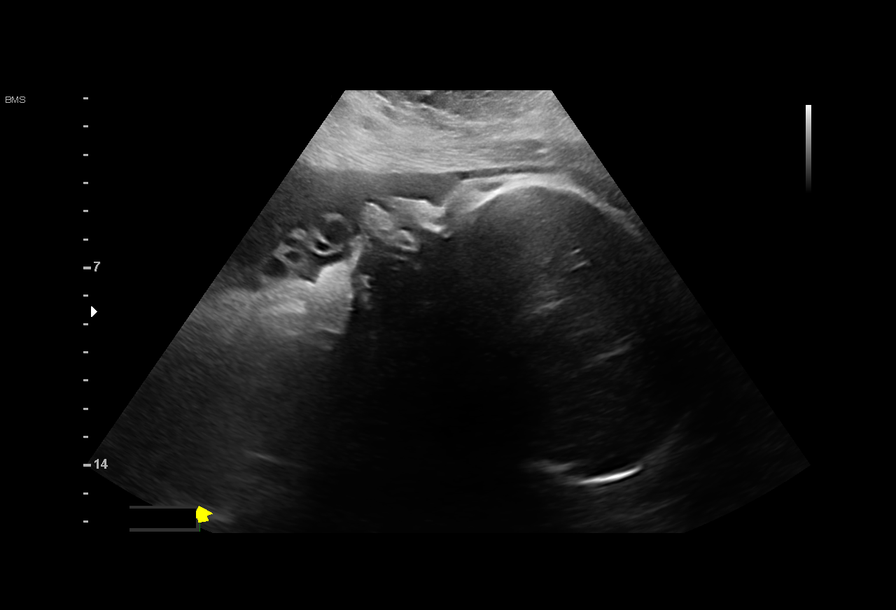
[im 18/25]
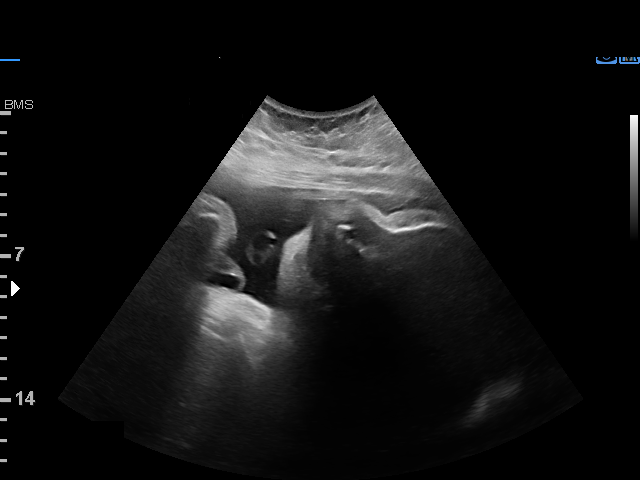
[im 20/25]
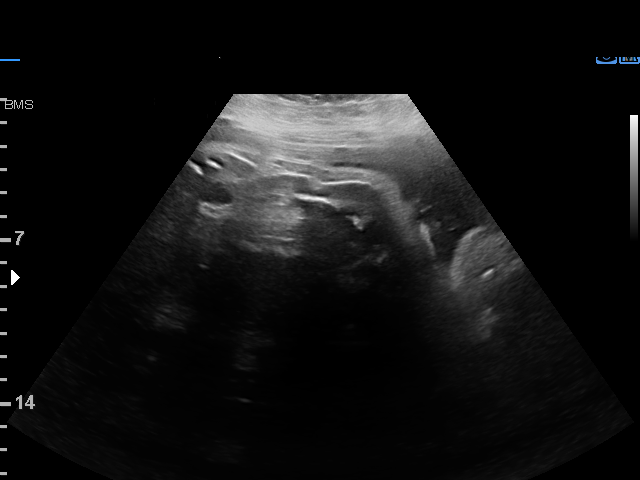
[im 22/25]
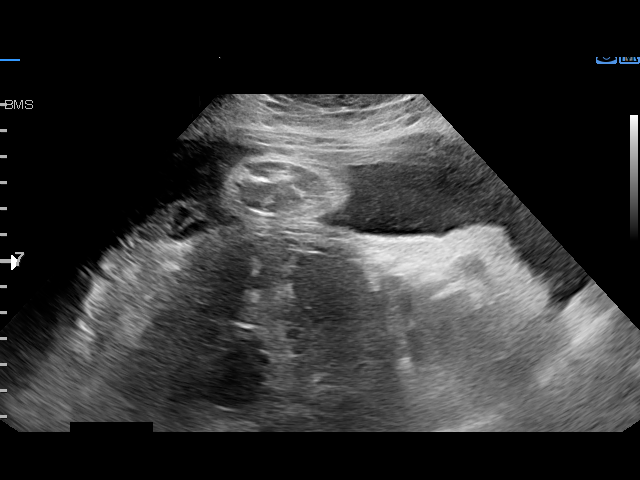
[im 24/25]
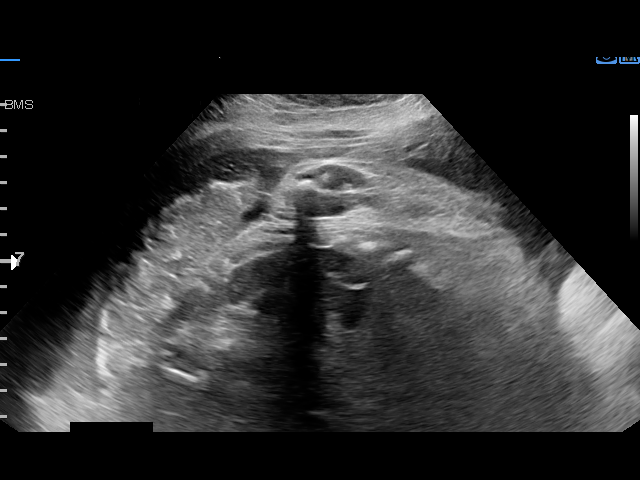

[12 of 25 positions shown; findings below may reference images not displayed]

Obstetrics &
                                                            Gynecology
                                                            [UM] TANU
                                                            TANU.
                   CNM

Indications

 Obesity complicating pregnancy, third          [UM]
 trimester BMI 40
 Low lying placenta, antepartum (Resolved)      [UM]
 Medical complication of pregnancy (IIH -       [UM]
 new dx)
 37 weeks gestation of pregnancy
Fetal Evaluation

 Num Of Fetuses:         1
 Fetal Heart Rate(bpm):  147
 Cardiac Activity:       Observed
 Presentation:           Cephalic
 Placenta:               Posterior
 P. Cord Insertion:      Previously Visualized

 Amniotic Fluid
 AFI FV:      Within normal limits

 AFI Sum(cm)     %Tile       Largest Pocket(cm)
 16.18           61

 RUQ(cm)       RLQ(cm)       LUQ(cm)        LLQ(cm)

Biophysical Evaluation

 Amniotic F.V:   Within normal limits       F. Tone:        Observed
 F. Movement:    Observed                   Score:          [DATE]
 F. Breathing:   Observed
Gestational Age

 LMP:           37w 2d        Date:  [DATE]                 EDD:   [DATE]
 Best:          37w 2d     Det. By:  LMP  ([DATE])          EDD:   [DATE]
Anatomy

 Diaphragm:             Appears normal         Bladder:                Appears normal
 Stomach:               Appears normal, left
                        sided
Impression

 Antenatal testing performed given maternal elevated BMI
 The biophysical profile was [DATE] with good fetal movement and
 amniotic fluid volume.
Recommendations

 Continue weekly testing

## 2021-06-14 ENCOUNTER — Inpatient Hospital Stay (HOSPITAL_COMMUNITY)
Admission: AD | Admit: 2021-06-14 | Discharge: 2021-06-14 | Disposition: A | Discharge status: Home / Self Care | Payer: Medicaid Other | Attending: Obstetrics and Gynecology | Admitting: Obstetrics and Gynecology

## 2021-06-14 ENCOUNTER — Other Ambulatory Visit: Payer: Self-pay

## 2021-06-14 NOTE — MAU Note (Signed)
Per registration pt stated she was not feeling contractions and she was going home.

## 2021-06-15 ENCOUNTER — Other Ambulatory Visit: Payer: Self-pay | Admitting: Obstetrics & Gynecology

## 2021-06-15 ENCOUNTER — Telehealth (HOSPITAL_COMMUNITY): Payer: Self-pay | Admitting: *Deleted

## 2021-06-15 NOTE — Telephone Encounter (Signed)
Preadmission screen  

## 2021-06-16 ENCOUNTER — Other Ambulatory Visit: Payer: Self-pay

## 2021-06-16 ENCOUNTER — Encounter: Payer: Self-pay | Admitting: *Deleted

## 2021-06-16 ENCOUNTER — Ambulatory Visit: Payer: Medicaid Other | Attending: Maternal & Fetal Medicine

## 2021-06-16 ENCOUNTER — Ambulatory Visit: Payer: Medicaid Other | Admitting: *Deleted

## 2021-06-16 VITALS — BP 139/79 | HR 107

## 2021-06-16 DIAGNOSIS — Z3A38 38 weeks gestation of pregnancy: Secondary | ICD-10-CM | POA: Diagnosis not present

## 2021-06-16 DIAGNOSIS — G932 Benign intracranial hypertension: Secondary | ICD-10-CM

## 2021-06-16 DIAGNOSIS — Z6841 Body Mass Index (BMI) 40.0 and over, adult: Secondary | ICD-10-CM

## 2021-06-16 DIAGNOSIS — E669 Obesity, unspecified: Secondary | ICD-10-CM

## 2021-06-16 DIAGNOSIS — O99353 Diseases of the nervous system complicating pregnancy, third trimester: Secondary | ICD-10-CM | POA: Insufficient documentation

## 2021-06-16 DIAGNOSIS — O99213 Obesity complicating pregnancy, third trimester: Secondary | ICD-10-CM | POA: Diagnosis present

## 2021-06-16 IMAGING — US US MFM OB FOLLOW-UP
1 series · 14 of 28 positions shown · non-contrast
Comparison: none

[Series 1: us mfm ob follow-up · 43 acquisitions, 14 frames shown]
[im 2/43]
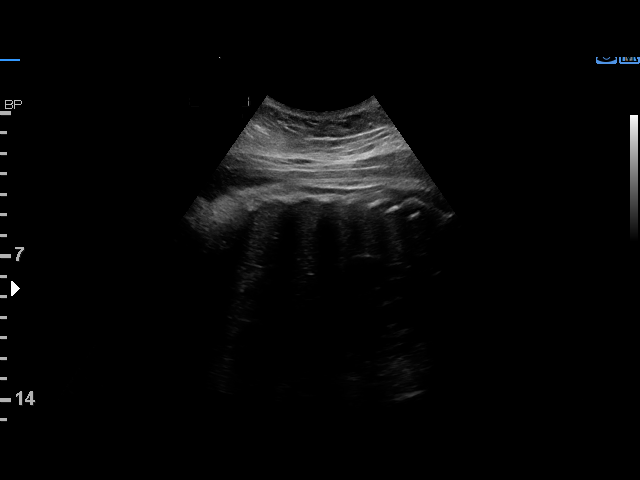
[im 5/43]
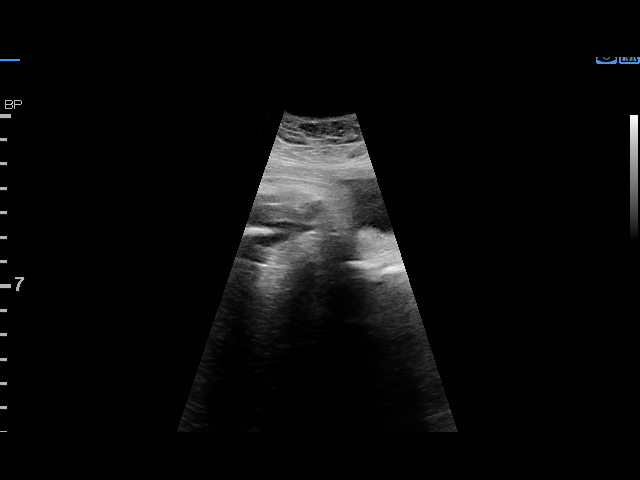
[im 8/43]
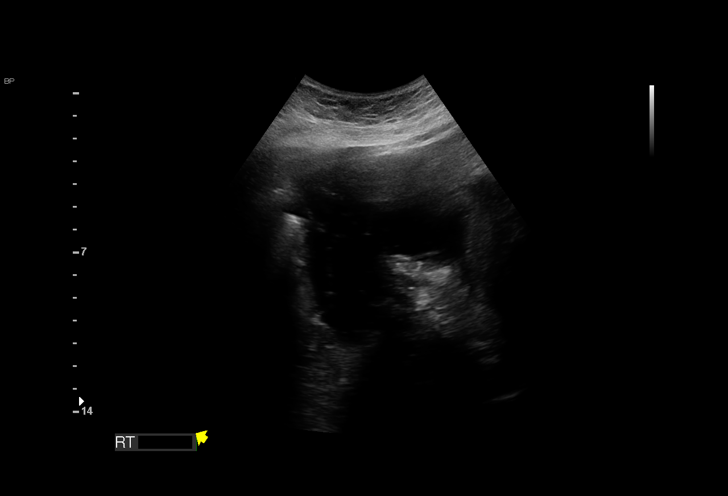
[im 11/43]
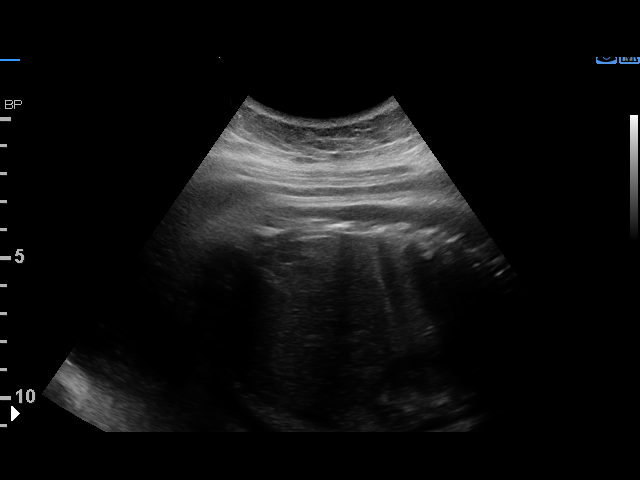
[im 15/43]
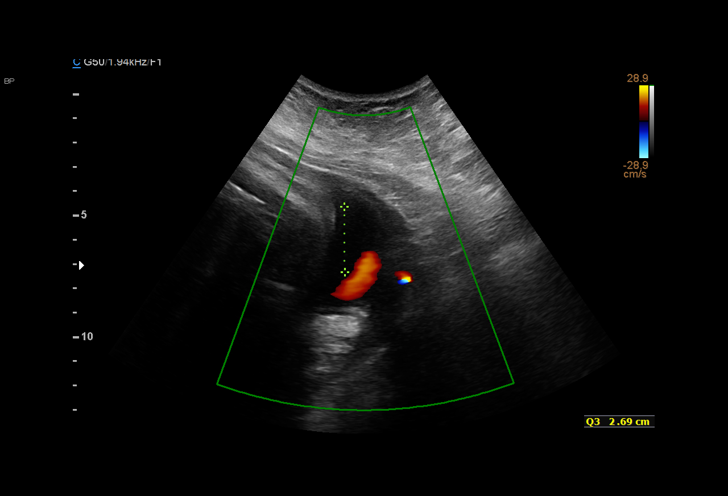
[im 18/43]
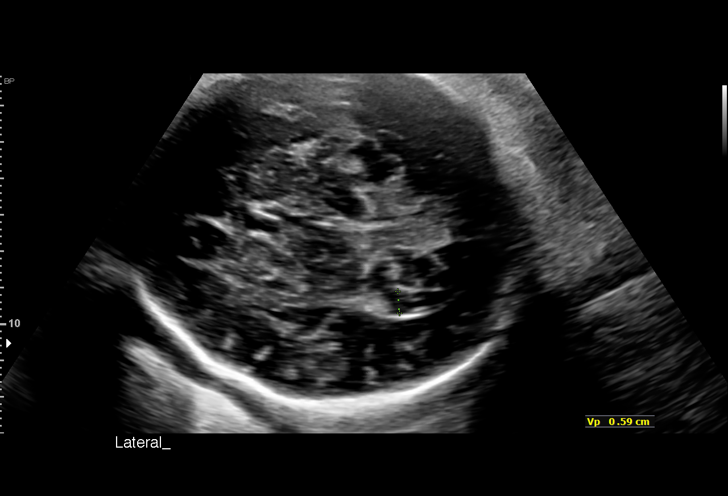
[im 21/43]
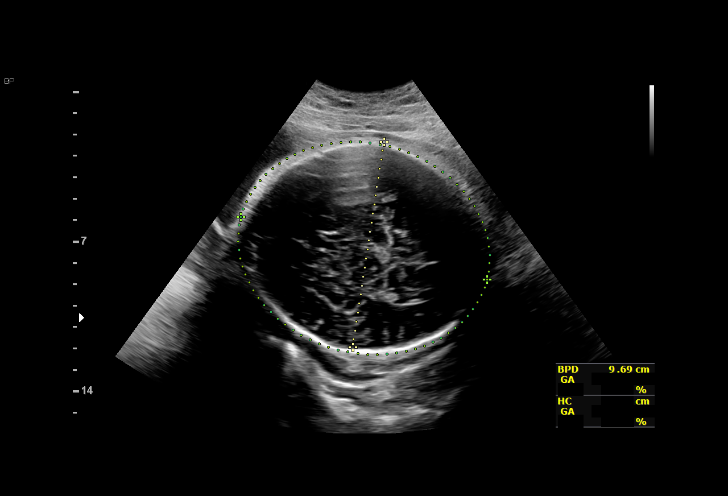
[im 24/43]
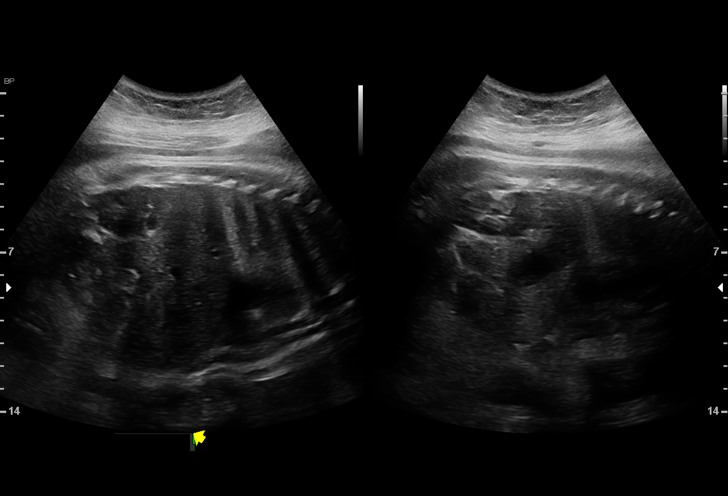
[im 27/43]
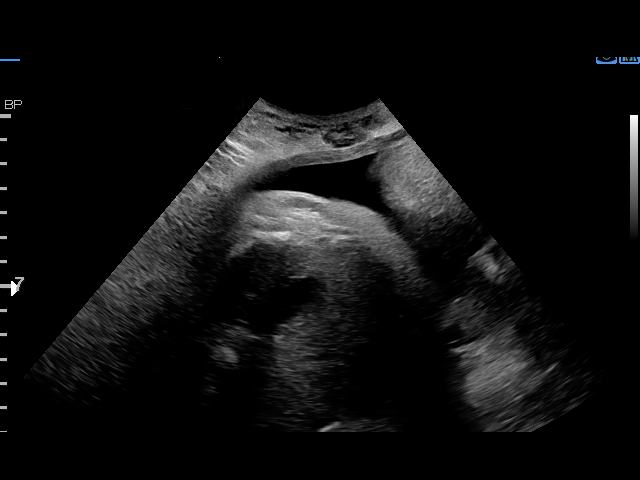
[im 30/43]
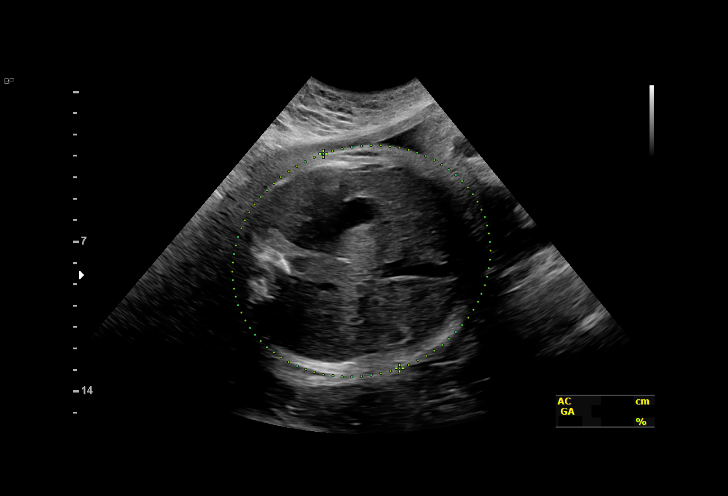
[im 33/43]
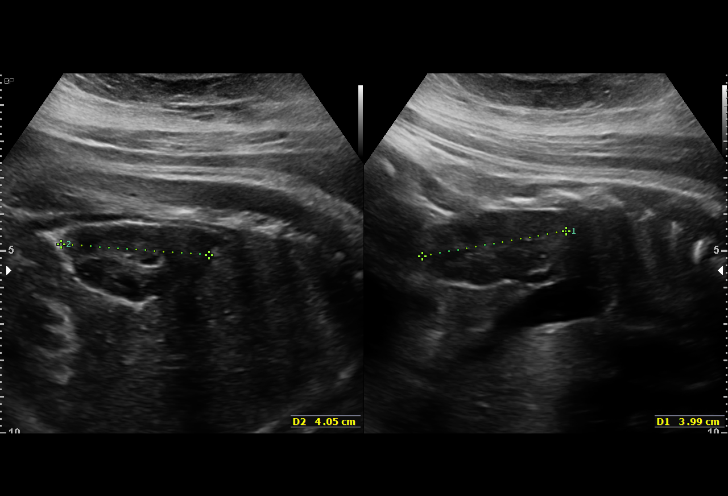
[im 36/43]
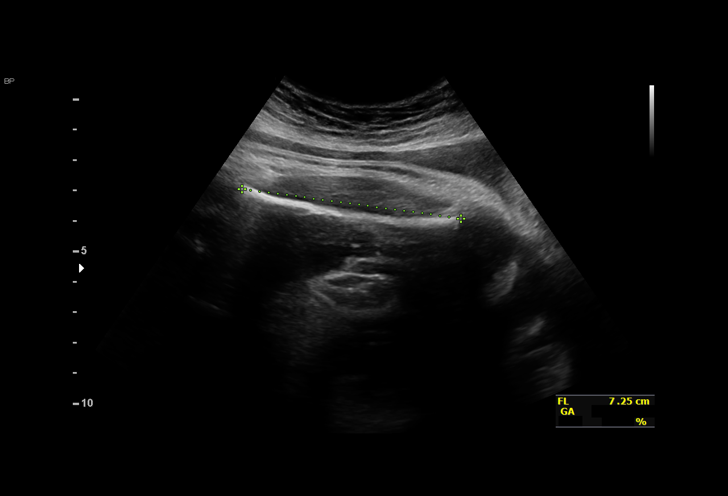
[im 39/43]
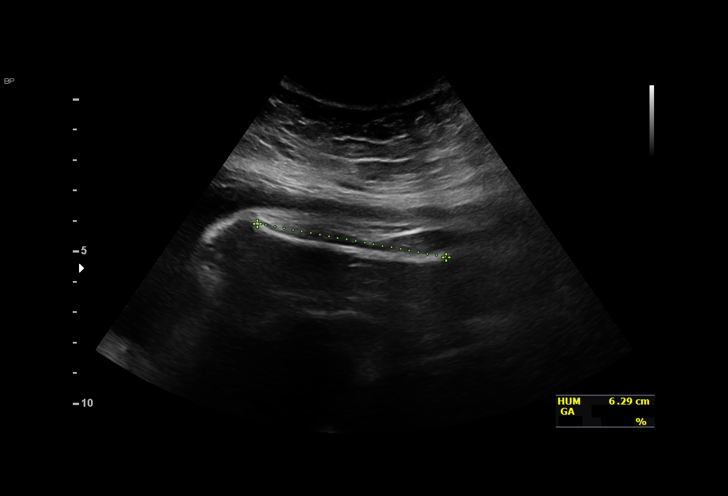
[im 43/43]
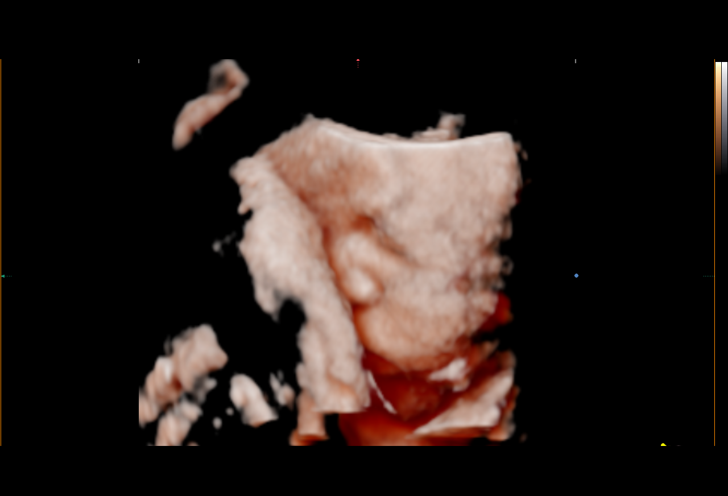

[14 of 28 positions shown; findings below may reference images not displayed]

Obstetrics &
                                                            Gynecology
                                                            [UM] CEEJAY
                                                            CEEJAY.
                   CNM

                                                      CEEJAY

Indications

 Obesity complicating pregnancy, third          [UM]
 trimester BMI 40
 Low lying placenta, antepartum (Resolved)      [UM]
 Medical complication of pregnancy (IIH -       [UM]
 new dx)
 38 weeks gestation of pregnancy
Fetal Evaluation

 Num Of Fetuses:         1
 Fetal Heart Rate(bpm):  145
 Cardiac Activity:       Observed
 Presentation:           Cephalic
 Placenta:               Posterior
 P. Cord Insertion:      Previously Visualized

 Amniotic Fluid
 AFI FV:      Within normal limits

 AFI Sum(cm)     %Tile       Largest Pocket(cm)
 14.5            56
 RUQ(cm)       RLQ(cm)       LUQ(cm)        LLQ(cm)

Biophysical Evaluation

 Amniotic F.V:   Pocket => 2 cm             F. Tone:        Observed
 F. Movement:    Observed                   Score:          [DATE]
 F. Breathing:   Observed
Biometry

 BPD:      96.6  mm     G. Age:  39w 3d         94  %    CI:        81.04   %    70 - 86
                                                         FL/HC:      21.3   %    20.9 -
 HC:      338.8  mm     G. Age:  38w 6d         47  %    HC/AC:      0.94        0.92 -
 AC:       362   mm     G. Age:  40w 1d         97  %    FL/BPD:     74.8   %    71 - 87
 FL:       72.3  mm     G. Age:  37w 0d         23  %    FL/AC:      20.0   %    20 - 24
 HUM:      63.4  mm     G. Age:  36w 6d         48  %
 CER:      96.4  mm     G. Age:  N/A        > 97.7  %

 LV:        5.9  mm

 Est. FW:    [UM]  gm      8 lb 4 oz     85  %
Gestational Age

 LMP:           38w 2d        Date:  [DATE]                 EDD:   [DATE]
 U/S Today:     38w 6d                                        EDD:   [DATE]
 Best:          38w 2d     Det. By:  LMP  ([DATE])          EDD:   [DATE]
Anatomy

 Cranium:               Appears normal         Aortic Arch:            Previously seen
 Cavum:                 Appears normal         Ductal Arch:            Previously seen
 Ventricles:            Appears normal         Diaphragm:              Appears normal
 Choroid Plexus:        Previously seen        Stomach:                Appears normal, left
                                                                       sided
 Cerebellum:            Previously seen        Abdomen:                Previously seen
 Posterior Fossa:       Previously seen        Abdominal Wall:         Previously seen
 Nuchal Fold:           Not applicable (>20    Cord Vessels:           Previously seen
                        wks GA)
 Face:                  Orbits and profile     Kidneys:                Appear normal
                        previously seen
 Lips:                  Previously seen        Bladder:                Appears normal
 Thoracic:              Previously seen        Spine:                  Previously seen
 Heart:                 Appears normal         Upper Extremities:      Previously seen
                        (4CH, axis, and
                        situs)
 RVOT:                  Previously seen        Lower Extremities:      Previously seen
 LVOT:                  Previously seen

 Other:  Technicallly difficult due to advanced GA and maternal habitus.
Cervix Uterus Adnexa

 Cervix
 Not visualized (advanced GA >[UM])
 Uterus
 Normal shape and size.

 Right Ovary
 Not visualized.

 Left Ovary
 Not visualized.

 Cul De Sac
 No free fluid seen.

 Adnexa
 No adnexal mass visualized.
Impression

 Idiopathic intracranial hypertension.  Patient is not taking
 Diamox now.  She had ophthalmology examination yesterday
 and, apparently, papilledema was not present.  Patient does
 not have symptoms.

 Fetal growth is appropriate for gestational age.  Amniotic fluid
 is normal good fetal activity seen.  Antenatal testing is
 reassuring.  BPP [DATE].  Cephalic presentation.
Recommendations

 - Delivery may be considered at 39 weeks gestation if cervix
 is favorable for induction of labor.
 -BPP next week if undelivered.
                 CEEJAY

## 2021-06-18 ENCOUNTER — Encounter (HOSPITAL_COMMUNITY): Payer: Self-pay | Admitting: *Deleted

## 2021-06-18 ENCOUNTER — Telehealth (HOSPITAL_COMMUNITY): Payer: Self-pay | Admitting: *Deleted

## 2021-06-18 NOTE — Telephone Encounter (Signed)
Preadmission screen  

## 2021-06-23 ENCOUNTER — Ambulatory Visit: Payer: Medicaid Other | Admitting: *Deleted

## 2021-06-23 ENCOUNTER — Encounter: Payer: Self-pay | Admitting: *Deleted

## 2021-06-23 ENCOUNTER — Ambulatory Visit (HOSPITAL_BASED_OUTPATIENT_CLINIC_OR_DEPARTMENT_OTHER): Payer: Medicaid Other

## 2021-06-23 ENCOUNTER — Other Ambulatory Visit: Payer: Self-pay

## 2021-06-23 VITALS — BP 143/78 | HR 97

## 2021-06-23 DIAGNOSIS — O99213 Obesity complicating pregnancy, third trimester: Secondary | ICD-10-CM

## 2021-06-23 DIAGNOSIS — E668 Other obesity: Secondary | ICD-10-CM

## 2021-06-23 DIAGNOSIS — Z6841 Body Mass Index (BMI) 40.0 and over, adult: Secondary | ICD-10-CM | POA: Insufficient documentation

## 2021-06-23 DIAGNOSIS — Z3A39 39 weeks gestation of pregnancy: Secondary | ICD-10-CM | POA: Diagnosis not present

## 2021-06-23 DIAGNOSIS — G932 Benign intracranial hypertension: Secondary | ICD-10-CM

## 2021-06-23 DIAGNOSIS — O4413 Placenta previa with hemorrhage, third trimester: Secondary | ICD-10-CM

## 2021-06-23 IMAGING — US US MFM FETAL BPP W/O NON-STRESS
1 series · 14 of 28 positions shown · non-contrast
Comparison: none

[Series 1: us mfm fetal bpp w/o non-stress · 35 acquisitions, 14 frames shown]
[im 2/35]
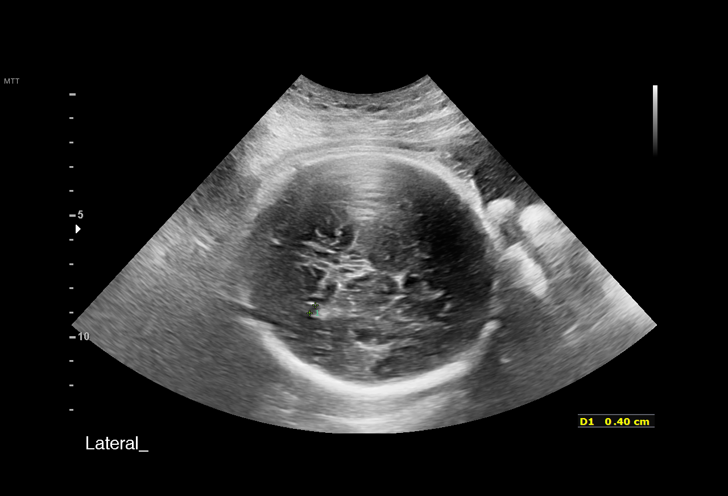
[im 4/35]
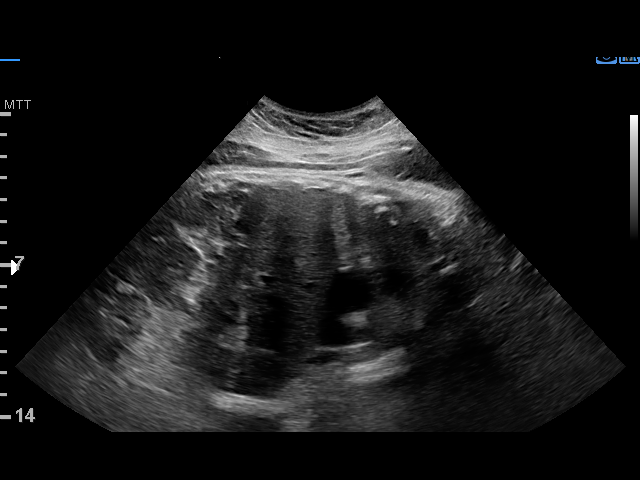
[im 7/35]
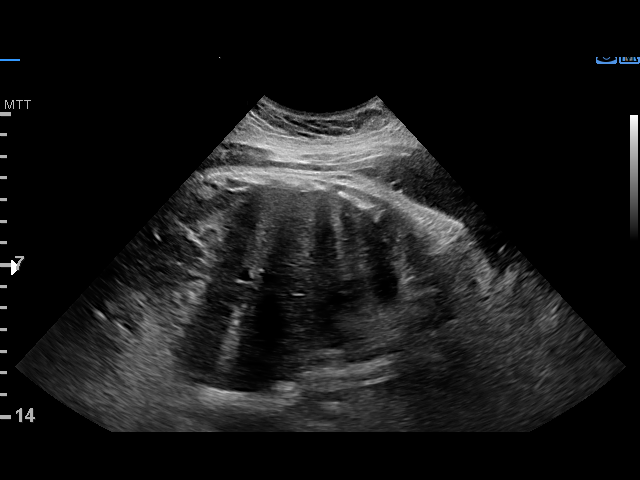
[im 9/35]
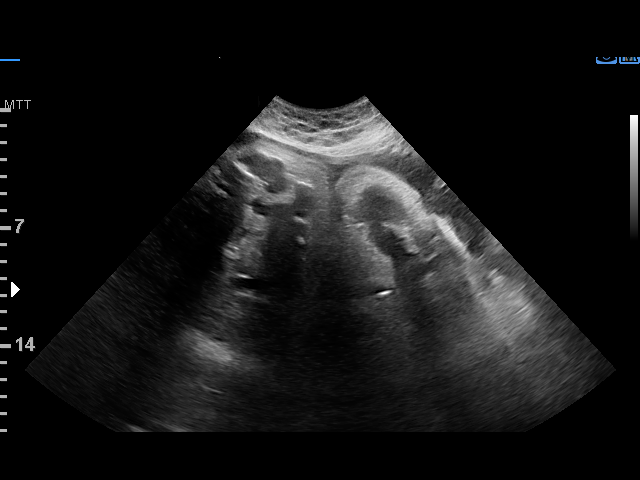
[im 12/35]
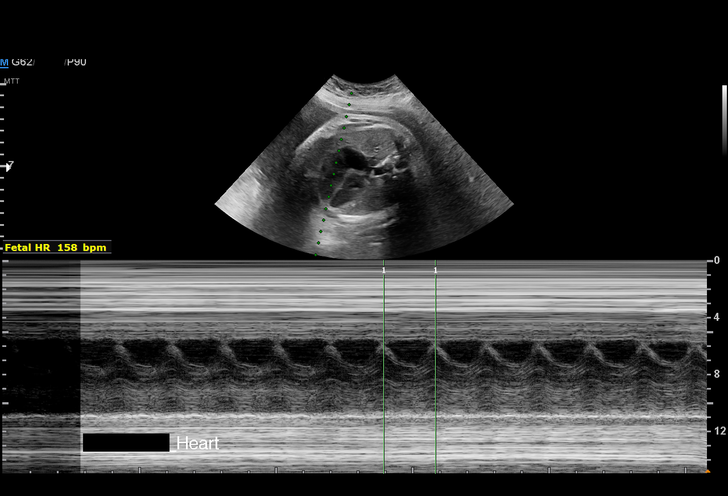
[im 14/35]
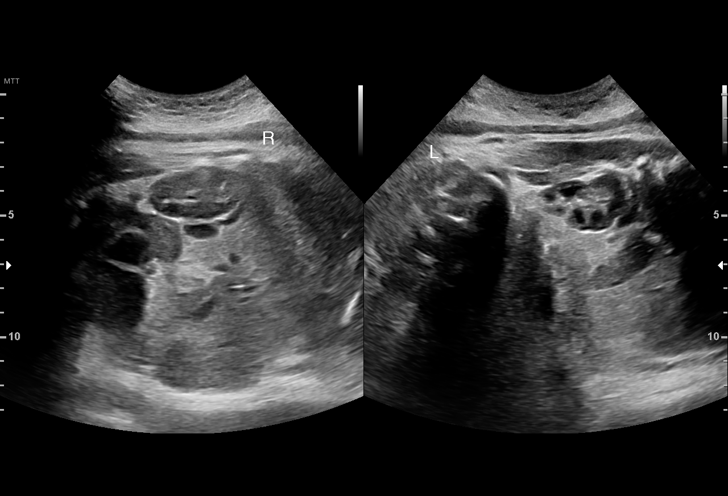
[im 17/35]
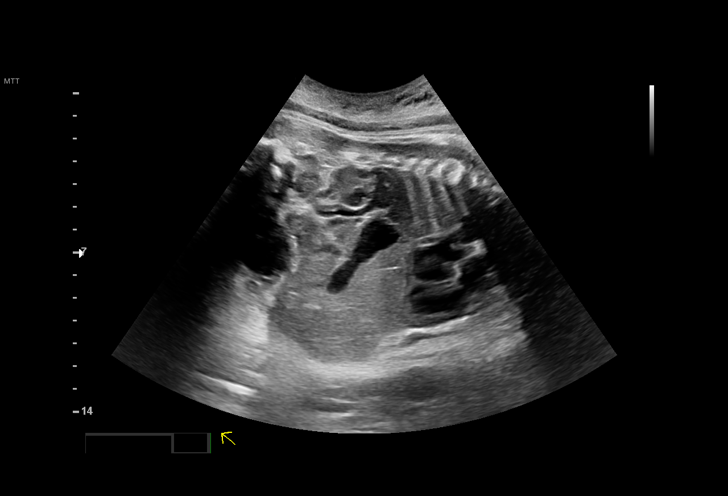
[im 19/35]
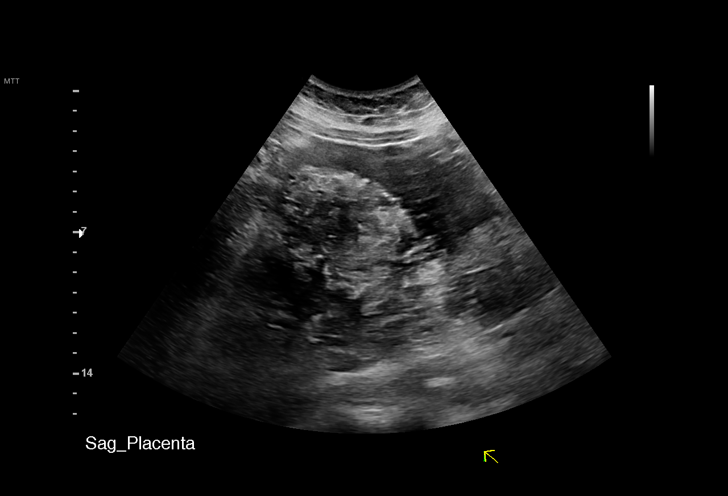
[im 22/35]
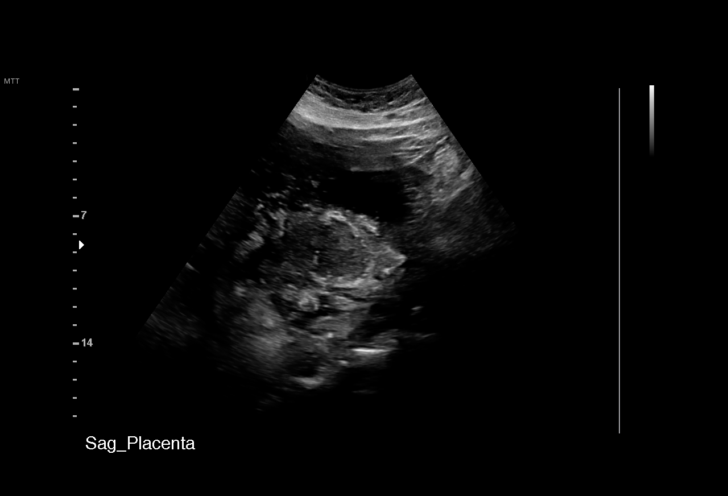
[im 24/35]
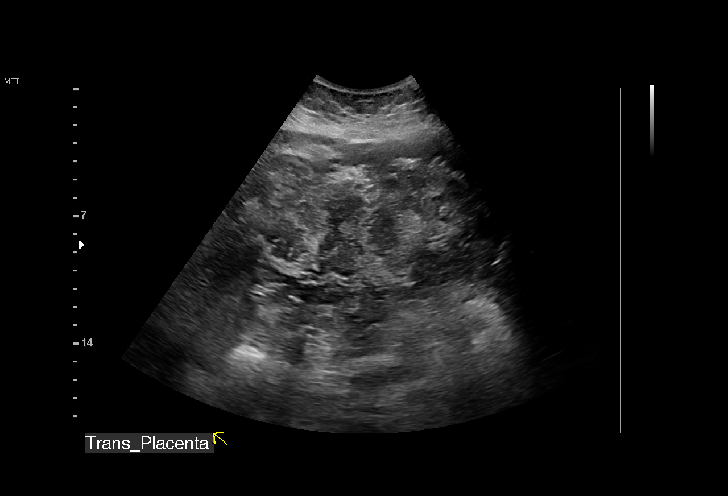
[im 27/35]
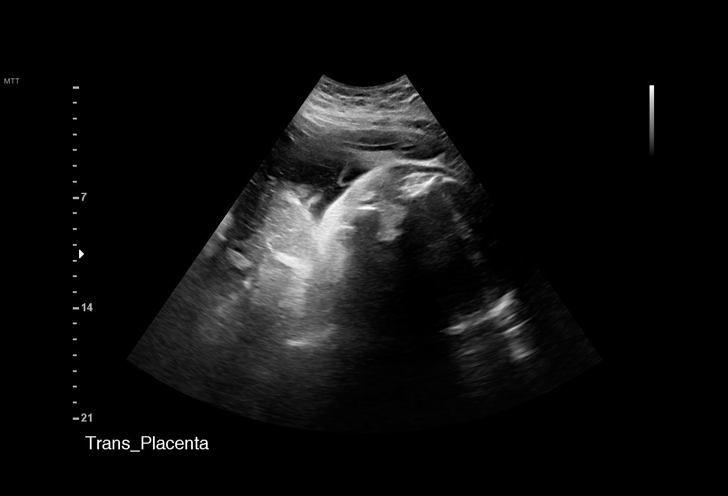
[im 29/35]
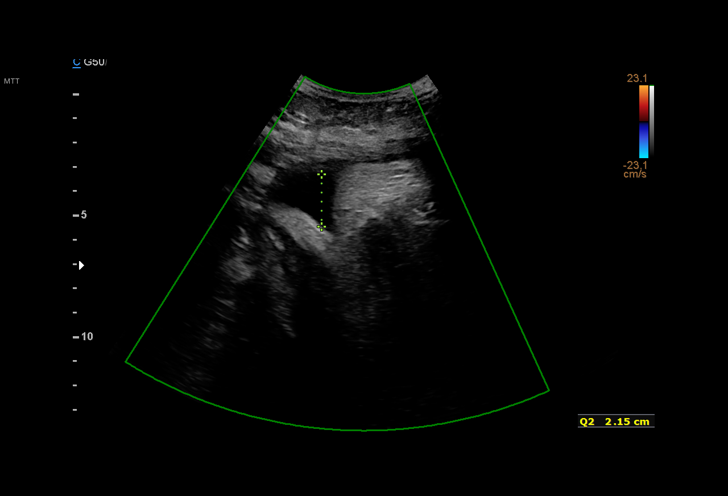
[im 32/35]
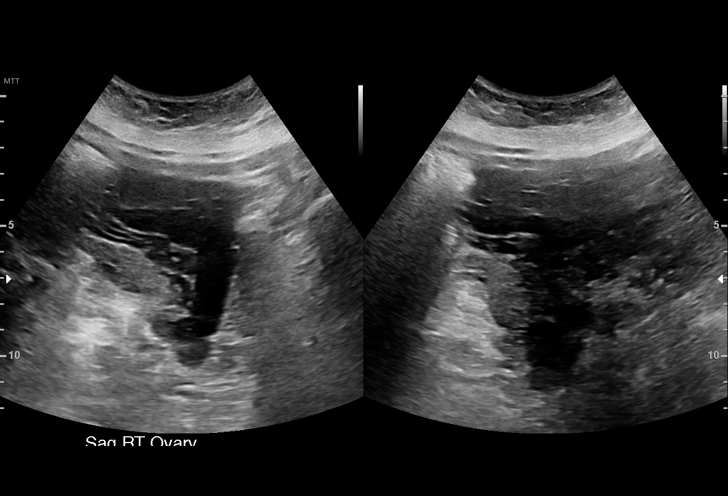
[im 35/35]
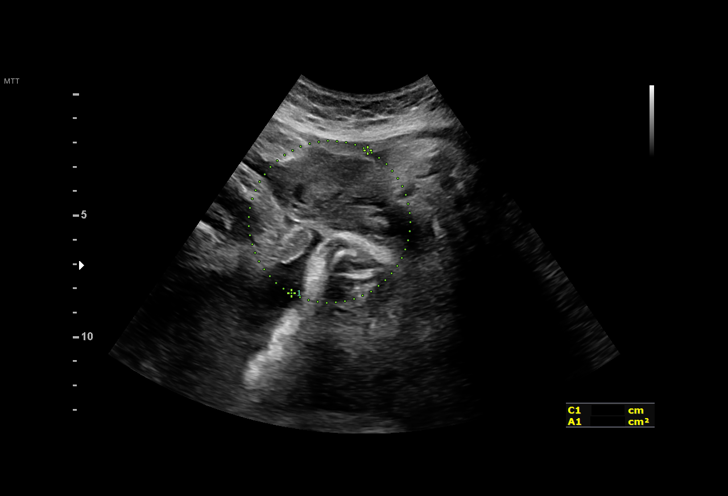

[14 of 28 positions shown; findings below may reference images not displayed]

Obstetrics &
                                                            Gynecology
                                                            [KS] SCHOCK
                                                            SCHOCK.
                   CNM

Indications

 Obesity complicating pregnancy, third          [KS]
 trimester BMI 40
 Low lying placenta, antepartum (Resolved)      [KS]
 39 weeks gestation of pregnancy
 Medical complication of pregnancy (IIH -       [KS]
 new dx)
Fetal Evaluation

 Num Of Fetuses:         1
 Fetal Heart Rate(bpm):  158
 Cardiac Activity:       Observed
 Presentation:           Cephalic
 Placenta:               Posterior
 P. Cord Insertion:      Previously Visualized

 Amniotic Fluid
 AFI FV:      Within normal limits

 AFI Sum(cm)     %Tile       Largest Pocket(cm)
 16.6            69

 RUQ(cm)       RLQ(cm)       LUQ(cm)        LLQ(cm)

Biophysical Evaluation

 Amniotic F.V:   Pocket => 2 cm             F. Tone:        Observed
 F. Movement:    Observed                   Score:          [DATE]
 F. Breathing:   Observed
Gestational Age

 LMP:           39w 2d        Date:  [DATE]                 EDD:   [DATE]
 Best:          39w 2d     Det. By:  LMP  ([DATE])          EDD:   [DATE]
Anatomy

 Ventricles:            Appears normal         Stomach:                Appears normal, left
                                                                       sided
 Thoracic:              Appears normal         Kidneys:                Appear normal
 Heart:                 Appears normal         Bladder:                Appears normal
                        (4CH, axis, and
                        situs)
 Diaphragm:             Appears normal

 Other:  Technicallly difficult due to advanced GA, fetal position, and maternal
         habitus.
Cervix Uterus Adnexa

 Cervix
 Not visualized (advanced GA >[KS])

 Uterus
 No abnormality visualized.

 Right Ovary
 Within normal limits.

 Left Ovary
 Within normal limits.

 Cul De Sac
 No free fluid seen.

 Adnexa
 No abnormality visualized.
Comments

 This patient was seen for a biophysical profile due to
 maternal obesity with a BMI of 40.  She denies any problems
 since her last exam.
 A biophysical profile performed today was [DATE].
 There was normal amniotic fluid noted on today's ultrasound
 exam.
 She is already scheduled for delivery in 5 days.
 No further exams were scheduled in our office.

## 2021-06-24 ENCOUNTER — Inpatient Hospital Stay (HOSPITAL_COMMUNITY)
Admission: AD | Admit: 2021-06-24 | Discharge: 2021-06-27 | DRG: 806 | Disposition: A | Discharge status: Home / Self Care | Payer: Medicaid Other | Attending: Obstetrics & Gynecology | Admitting: Obstetrics & Gynecology

## 2021-06-24 ENCOUNTER — Inpatient Hospital Stay (HOSPITAL_COMMUNITY): Payer: Medicaid Other | Admitting: Anesthesiology

## 2021-06-24 ENCOUNTER — Encounter (HOSPITAL_COMMUNITY): Payer: Self-pay | Admitting: Obstetrics and Gynecology

## 2021-06-24 ENCOUNTER — Encounter (HOSPITAL_COMMUNITY): Payer: Self-pay | Admitting: Obstetrics & Gynecology

## 2021-06-24 ENCOUNTER — Encounter: Payer: Self-pay | Admitting: Diagnostic Neuroimaging

## 2021-06-24 DIAGNOSIS — D509 Iron deficiency anemia, unspecified: Secondary | ICD-10-CM | POA: Diagnosis present

## 2021-06-24 DIAGNOSIS — Z3A39 39 weeks gestation of pregnancy: Secondary | ICD-10-CM | POA: Diagnosis not present

## 2021-06-24 DIAGNOSIS — O99354 Diseases of the nervous system complicating childbirth: Secondary | ICD-10-CM | POA: Diagnosis present

## 2021-06-24 DIAGNOSIS — B951 Streptococcus, group B, as the cause of diseases classified elsewhere: Secondary | ICD-10-CM | POA: Diagnosis present

## 2021-06-24 DIAGNOSIS — Z20822 Contact with and (suspected) exposure to covid-19: Secondary | ICD-10-CM | POA: Diagnosis present

## 2021-06-24 DIAGNOSIS — O26899 Other specified pregnancy related conditions, unspecified trimester: Secondary | ICD-10-CM

## 2021-06-24 DIAGNOSIS — O9902 Anemia complicating childbirth: Secondary | ICD-10-CM | POA: Diagnosis present

## 2021-06-24 DIAGNOSIS — Z6791 Unspecified blood type, Rh negative: Secondary | ICD-10-CM

## 2021-06-24 DIAGNOSIS — O368130 Decreased fetal movements, third trimester, not applicable or unspecified: Secondary | ICD-10-CM | POA: Diagnosis present

## 2021-06-24 DIAGNOSIS — O99824 Streptococcus B carrier state complicating childbirth: Secondary | ICD-10-CM | POA: Diagnosis present

## 2021-06-24 DIAGNOSIS — G932 Benign intracranial hypertension: Secondary | ICD-10-CM | POA: Diagnosis present

## 2021-06-24 DIAGNOSIS — O139 Gestational [pregnancy-induced] hypertension without significant proteinuria, unspecified trimester: Secondary | ICD-10-CM | POA: Diagnosis present

## 2021-06-24 DIAGNOSIS — O26893 Other specified pregnancy related conditions, third trimester: Secondary | ICD-10-CM | POA: Diagnosis present

## 2021-06-24 DIAGNOSIS — O134 Gestational [pregnancy-induced] hypertension without significant proteinuria, complicating childbirth: Secondary | ICD-10-CM | POA: Diagnosis present

## 2021-06-24 DIAGNOSIS — O133 Gestational [pregnancy-induced] hypertension without significant proteinuria, third trimester: Secondary | ICD-10-CM | POA: Diagnosis not present

## 2021-06-24 DIAGNOSIS — Z8669 Personal history of other diseases of the nervous system and sense organs: Secondary | ICD-10-CM

## 2021-06-24 LAB — COMPREHENSIVE METABOLIC PANEL
ALT: 14 U/L (ref 0–44)
AST: 18 U/L (ref 15–41)
Albumin: 2.6 g/dL — ABNORMAL LOW (ref 3.5–5.0)
Alkaline Phosphatase: 106 U/L (ref 38–126)
Anion gap: 6 (ref 5–15)
BUN: 7 mg/dL (ref 6–20)
CO2: 21 mmol/L — ABNORMAL LOW (ref 22–32)
Calcium: 8.8 mg/dL — ABNORMAL LOW (ref 8.9–10.3)
Chloride: 105 mmol/L (ref 98–111)
Creatinine, Ser: 0.53 mg/dL (ref 0.44–1.00)
GFR, Estimated: >60 mL/min (ref >60)
Glucose, Bld: 90 mg/dL (ref 70–99)
Potassium: 3.9 mmol/L (ref 3.5–5.1)
Sodium: 132 mmol/L — ABNORMAL LOW (ref 135–145)
Total Bilirubin: 0.4 mg/dL (ref 0.3–1.2)
Total Protein: 5.6 g/dL — ABNORMAL LOW (ref 6.5–8.1)

## 2021-06-24 LAB — CBC
HCT: 28.6 % — ABNORMAL LOW (ref 36.0–46.0)
HCT: 28.6 % — ABNORMAL LOW (ref 36.0–46.0)
Hemoglobin: 8.5 g/dL — ABNORMAL LOW (ref 12.0–15.0)
Hemoglobin: 8.9 g/dL — ABNORMAL LOW (ref 12.0–15.0)
MCH: 23.2 pg — ABNORMAL LOW (ref 26.0–34.0)
MCH: 23.9 pg — ABNORMAL LOW (ref 26.0–34.0)
MCHC: 29.7 g/dL — ABNORMAL LOW (ref 30.0–36.0)
MCHC: 31.1 g/dL (ref 30.0–36.0)
MCV: 77.9 fL — ABNORMAL LOW (ref 80.0–100.0)
MCV: 76.9 fL — ABNORMAL LOW (ref 80.0–100.0)
Platelets: 263 10*3/uL (ref 150–400)
Platelets: 257 10*3/uL (ref 150–400)
RBC: 3.67 MIL/uL — ABNORMAL LOW (ref 3.87–5.11)
RBC: 3.72 MIL/uL — ABNORMAL LOW (ref 3.87–5.11)
RDW: 16.4 % — ABNORMAL HIGH (ref 11.5–15.5)
RDW: 16.6 % — ABNORMAL HIGH (ref 11.5–15.5)
WBC: 11.7 10*3/uL — ABNORMAL HIGH (ref 4.0–10.5)
WBC: 14.9 10*3/uL — ABNORMAL HIGH (ref 4.0–10.5)
nRBC: 0 % (ref 0.0–0.2)
nRBC: 0 % (ref 0.0–0.2)

## 2021-06-24 LAB — URINALYSIS, ROUTINE W REFLEX MICROSCOPIC
Bacteria, UA: NONE SEEN
Bilirubin Urine: NEGATIVE
Glucose, UA: NEGATIVE mg/dL
Hgb urine dipstick: NEGATIVE
Ketones, ur: NEGATIVE mg/dL
Leukocytes,Ua: NEGATIVE
Nitrite: NEGATIVE
Protein, ur: 30 mg/dL — AB
Specific Gravity, Urine: 1.026 (ref 1.005–1.030)
pH: 6 (ref 5.0–8.0)

## 2021-06-24 LAB — PROTEIN / CREATININE RATIO, URINE
Creatinine, Urine: 230.77 mg/dL
Protein Creatinine Ratio: 0.11 mg/mg{Cre} (ref 0.00–0.15)
Total Protein, Urine: 25 mg/dL

## 2021-06-24 LAB — TYPE AND SCREEN
ABO/RH(D): B NEG
Antibody Screen: NEGATIVE

## 2021-06-24 LAB — RPR: RPR Ser Ql: NONREACTIVE

## 2021-06-24 LAB — RESP PANEL BY RT-PCR (FLU A&B, COVID) ARPGX2
Influenza A by PCR: NEGATIVE
Influenza B by PCR: NEGATIVE
SARS Coronavirus 2 by RT PCR: NEGATIVE

## 2021-06-24 LAB — ABO/RH: ABO/RH(D): B NEG

## 2021-06-24 MED ORDER — SODIUM CHLORIDE 0.9 % IV SOLN
5.0000 10*6.[IU] | Freq: Once | INTRAVENOUS | Status: AC
  Administered 2021-06-24: 05:00:00 5 10*6.[IU] via INTRAVENOUS
  Filled 2021-06-24: qty 5

## 2021-06-24 MED ORDER — SOD CITRATE-CITRIC ACID 500-334 MG/5ML PO SOLN: 30.0000 mL | ORAL | Status: DC | PRN

## 2021-06-24 MED ORDER — OXYTOCIN BOLUS FROM INFUSION
333.0000 mL | Freq: Once | INTRAVENOUS | Status: AC
  Administered 2021-06-25: 333 mL via INTRAVENOUS

## 2021-06-24 MED ORDER — PHENYLEPHRINE 40 MCG/ML (10ML) SYRINGE FOR IV PUSH (FOR BLOOD PRESSURE SUPPORT): 80.0000 ug | PREFILLED_SYRINGE | INTRAVENOUS | Status: DC | PRN

## 2021-06-24 MED ORDER — DIPHENHYDRAMINE HCL 50 MG/ML IJ SOLN: 12.5000 mg | INTRAMUSCULAR | Status: DC | PRN

## 2021-06-24 MED ORDER — LACTATED RINGERS IV SOLN: 500.0000 mL | Freq: Once | INTRAVENOUS | Status: DC

## 2021-06-24 MED ORDER — TERBUTALINE SULFATE 1 MG/ML IJ SOLN: 0.2500 mg | Freq: Once | INTRAMUSCULAR | Status: DC | PRN

## 2021-06-24 MED ORDER — OXYTOCIN-SODIUM CHLORIDE 30-0.9 UT/500ML-% IV SOLN
2.5000 [IU]/h | INTRAVENOUS | Status: DC
  Filled 2021-06-24 (×2): qty 500

## 2021-06-24 MED ORDER — FLEET ENEMA 7-19 GM/118ML RE ENEM: 1.0000 | ENEMA | RECTAL | Status: DC | PRN

## 2021-06-24 MED ORDER — EPHEDRINE 5 MG/ML INJ: 10.0000 mg | INTRAVENOUS | Status: DC | PRN

## 2021-06-24 MED ORDER — LIDOCAINE HCL (PF) 1 % IJ SOLN: 30.0000 mL | INTRAMUSCULAR | Status: DC | PRN

## 2021-06-24 MED ORDER — LACTATED RINGERS IV SOLN: 500.0000 mL | INTRAVENOUS | Status: DC | PRN

## 2021-06-24 MED ORDER — LACTATED RINGERS IV SOLN: INTRAVENOUS | Status: DC

## 2021-06-24 MED ORDER — MISOPROSTOL 50MCG HALF TABLET
ORAL_TABLET | ORAL | Status: AC
  Filled 2021-06-24: qty 1

## 2021-06-24 MED ORDER — ACETAMINOPHEN 325 MG PO TABS: 650.0000 mg | ORAL_TABLET | ORAL | Status: DC | PRN

## 2021-06-24 MED ORDER — OXYTOCIN-SODIUM CHLORIDE 30-0.9 UT/500ML-% IV SOLN
1.0000 m[IU]/min | INTRAVENOUS | Status: DC
  Administered 2021-06-24: 23:00:00 2 m[IU]/min via INTRAVENOUS

## 2021-06-24 MED ORDER — FENTANYL-BUPIVACAINE-NACL 0.5-0.125-0.9 MG/250ML-% EP SOLN
12.0000 mL/h | EPIDURAL | Status: DC | PRN
  Administered 2021-06-25 (×2): 12 mL/h via EPIDURAL
  Filled 2021-06-24 (×2): qty 250

## 2021-06-24 MED ORDER — ONDANSETRON HCL 4 MG/2ML IJ SOLN
4.0000 mg | Freq: Four times a day (QID) | INTRAMUSCULAR | Status: DC | PRN
  Administered 2021-06-25: 4 mg via INTRAVENOUS
  Filled 2021-06-24: qty 2

## 2021-06-24 MED ORDER — MISOPROSTOL 50MCG HALF TABLET
50.0000 ug | ORAL_TABLET | ORAL | Status: DC
  Administered 2021-06-24 (×4): 50 ug via ORAL
  Filled 2021-06-24 (×3): qty 1

## 2021-06-24 MED ORDER — FENTANYL CITRATE (PF) 100 MCG/2ML IJ SOLN
50.0000 ug | INTRAMUSCULAR | Status: DC | PRN
  Administered 2021-06-24 (×2): 100 ug via INTRAVENOUS
  Filled 2021-06-24 (×2): qty 2

## 2021-06-24 MED ORDER — PENICILLIN G POT IN DEXTROSE 60000 UNIT/ML IV SOLN
3.0000 10*6.[IU] | INTRAVENOUS | Status: DC
  Administered 2021-06-24 – 2021-06-25 (×7): 3 10*6.[IU] via INTRAVENOUS
  Filled 2021-06-24 (×11): qty 50

## 2021-06-24 NOTE — Progress Notes (Signed)
Subjective:    Pt is tearful and not coping well with the pain of contractions. Discussed pain management options and pt requests an epidural.   Objective:    VS: BP (!) 146/70    Pulse 89    Temp 97.7 F (36.5 C) (Oral)    Resp 16    Ht 5\' 3"  (1.6 m)    Wt 114.8 kg    LMP 09/21/2020 (Exact Date)    SpO2 100%    BMI 44.82 kg/m  FHR : baseline 145 / variability moderate / accelerations present / absent decelerations Toco: contractions every 2-4 minutes  Membranes: SROM x3 hrs, clear fluid Dilation: 1 Effacement (%): 60 Cervical Position: Posterior Station: -3 Presentation: Vertex Exam by:: [email or web address], CNM   Assessment/Plan:   26 y.o. G1P0 [redacted]w[redacted]d IOL for GHTN    -mild to moderate range BP, negative neuro symptoms  Labor:  S/P buccal misoprostol x 4 doses, SROM clear fluid, starting Pitocin now Fetal Wellbeing:  Category I Pain Control:   has used IV pain meds, now requesting epidural I/D:   GBS pos, PCN x 4 doses Anticipated MOD:  NSVD  [redacted]w[redacted]d MSN, CNM 06/24/2021 10:23 PM

## 2021-06-24 NOTE — Anesthesia Preprocedure Evaluation (Signed)
Anesthesia Evaluation  Patient identified by MRN, date of birth, ID band Patient awake    Reviewed: Allergy & Precautions, H&P , NPO status , Patient's Chart, lab work & pertinent test results  History of Anesthesia Complications Negative for: history of anesthetic complications  Airway Mallampati: II  TM Distance: >3 FB     Dental   Pulmonary neg pulmonary ROS,    Pulmonary exam normal        Cardiovascular hypertension,  Rhythm:regular Rate:Normal     Neuro/Psych Idiopathic intracranial hypertension s/p therapeutic lumbar puncture negative psych ROS   GI/Hepatic negative GI ROS, Neg liver ROS,   Endo/Other  Morbid obesity  Renal/GU      Musculoskeletal   Abdominal   Peds  Hematology  (+) Blood dyscrasia, anemia ,   Anesthesia Other Findings   Reproductive/Obstetrics (+) Pregnancy                             Anesthesia Physical Anesthesia Plan  ASA: 3  Anesthesia Plan: Epidural   Post-op Pain Management:    Induction:   PONV Risk Score and Plan:   Airway Management Planned:   Additional Equipment:   Intra-op Plan:   Post-operative Plan:   Informed Consent: I have reviewed the patients History and Physical, chart, labs and discussed the procedure including the risks, benefits and alternatives for the proposed anesthesia with the patient or authorized representative who has indicated his/her understanding and acceptance.       Plan Discussed with:   Anesthesia Plan Comments:         Anesthesia Quick Evaluation

## 2021-06-24 NOTE — MAU Note (Signed)
Vertex confirmed per [place] per MAU Provider, Mayford Knife, CNM

## 2021-06-24 NOTE — Progress Notes (Signed)
MD LABOR PROGRESS NOTE  Tracy Keller is a 26 y.o. G1P0 at [redacted]w[redacted]d admitted for induction of labor due to gestational hypertension.  Subjective: Patient w/o complaints, has mild cramping   Objective: BP 133/68    Pulse 89    Temp 98.1 F (36.7 C) (Oral)    Resp 16    Ht 5\' 3"  (1.6 m)    Wt 114.8 kg    LMP 09/21/2020 (Exact Date)    SpO2 100%    BMI 44.82 kg/m  No intake/output data recorded.  FHT:  FHR: 155 bpm, variability: moderate,  accelerations:  Present,  decelerations:  Absent UC:   irregular ctx SVE:   Dilation: 1 Effacement (%): Thick Station: -3 Exam by:: Burman Foster, CNM  Labs: Lab Results  Component Value Date   WBC 11.7 (H) 06/24/2021   HGB 8.5 (L) 06/24/2021   HCT 28.6 (L) 06/24/2021   MCV 77.9 (L) 06/24/2021   PLT 263 06/24/2021    Assessment / Plan: Induction of labor due to gestational hypertension, s/p misoprostol  For cervical ripening Labor:  latent phase Fetal Wellbeing:  Category I Pain Control:  Labor support without medications I/D:  n/a Anticipated MOD:  NSVD  Sanjuana Kava MD 06/24/2021, 7:44 AM

## 2021-06-24 NOTE — H&P (Signed)
OB ADMISSION/ HISTORY & PHYSICAL:  Admission Date: 06/24/2021  1:57 AM  Admit Diagnosis: Gestational hypertension  Tracy Keller is a 26 y.o. female G22P0 [redacted]w[redacted]d who presented to MAU fordecreased fetal movement. BP was elevated on 06/23/21 @ MFM appointment. Reports mild HA all evening that has resolved without intervention, denies visual changes and RUQ pain. Denies LOF and vaginal bleeding.   History of current pregnancy: G1P0   Patient entered care with [facility] at 8+4 wks.   EDC by 8+4 wk U/S.   Anatomy scan:  19+5 wks, complete w/ posterior placenta.   Antenatal testing: for BMI and Benign intracranial hypertension started at 34 weeks Last evaluation: 37+2 wks with MFM vertex/ posterior placenta/ AFI 16.2/ BPP 8/8/ EFW 8+4, 85%ile   Significant prenatal events:  Patient Active Problem List   Diagnosis Date Noted   Gestational hypertension 06/24/2021   Benign intracranial hypertension 06/24/2021   Rh negative, maternal 06/24/2021   Group beta Strep positive 06/24/2021   History of papilledema 06/24/2021   Pseudotumor cerebri syndrome 02/01/2021    Prenatal Labs: ABO, Rh: B/Negative/-- (07/05 0000) Antibody: Negative (07/05 0000) Rubella: Immune (07/05 0000)  RPR: Nonreactive (07/05 0000)  HBsAg: Negative (07/05 0000)  HIV: Non Reactive (09/03 0654)  GTT: normal screening GBS: Positive/-- (01/04 0000)  GC/CHL: neg/neg Genetics: Low-risk, negative Horizon, and normal AFP Tdap/influenza vaccines: received both this preg   OB History  Gravida Para Term Preterm AB Living  1            SAB IAB Ectopic Multiple Live Births               # Outcome Date GA Lbr Len/2nd Weight Sex Delivery Anes PTL Lv  1 Current             Medical / Surgical History: Past medical history:  Past Medical History:  Diagnosis Date   BMI 35.0-35.9,adult    History of idiopathic intracranial hypertension    treated with spinal tap   Intracranial hypertension    Medical history non-contributory      Past surgical history:  Past Surgical History:  Procedure Laterality Date   WISDOM TOOTH EXTRACTION     Family History:  Family History  Problem Relation Age of Onset   Pseudotumor cerebri Neg Hx     Social History:  reports that she has never smoked. She has never used smokeless tobacco. She reports that she does not drink alcohol and does not use drugs.  Allergies: Other   Current Medications at time of admission:  Prior to Admission medications   Medication Sig Start Date End Date Taking? Authorizing Provider  ondansetron (ZOFRAN ODT) 4 MG disintegrating tablet Take 1 tablet (4 mg total) by mouth every 6 (six) hours as needed for nausea. 11/20/20  Yes Seabron Spates, CNM  Prenatal MV & Min w/FA-DHA (PRENATAL ADULT GUMMY/DHA/FA PO) Take 2 tablets by mouth at bedtime.   Yes [provider]    Review of Systems: Constitutional: Negative   HENT: Negative   Eyes: Negative   Respiratory: Negative   Cardiovascular: Negative   Gastrointestinal: Negative  Genitourinary: neg for bloody show, neg for LOF   Musculoskeletal: Negative   Skin: Negative   Neurological: Negative   Endo/Heme/Allergies: Negative   Psychiatric/Behavioral: Negative    Physical Exam: VS: Blood pressure 119/70, pulse (!) 107, temperature 98.3 F (36.8 C), resp. rate 18, height 5\' 3"  (1.6 m), weight 114.8 kg, last menstrual period 09/21/2020, SpO2 99 %. AAO x3,  no signs of distress Cardiovascular: RRR Respiratory: Lung fields clear to ausculation GU/GI: Abdomen gravid, non-tender, non-distended, active FM, vertex, EFW 8.5# per Leopold's Extremities: trace edema, negative for pain, tenderness, and cords  Cervical exam:Dilation: 1 Effacement (%): Thick Station: -3 Exam by:: Burman Foster, CNM FHR: baseline rate 135 / variability moderate / accelerations present / absent decelerations TOCO: none   Prenatal Transfer Tool  Maternal Diabetes: No Genetic Screening: Normal Maternal  Ultrasounds/Referrals: Normal Fetal Ultrasounds or other Referrals:  Referred to Materal Fetal Medicine  Maternal Substance Abuse:  No Significant Maternal Medications:  None Significant Maternal Lab Results: Group B Strep positive    Assessment: 26 y.o. G1P0 [redacted]w[redacted]d  IOL for GHTN stage of labor FHR category 1 GBS pos Pain management plan: per pt's request   Plan:  Admit to L&D Cytotec 50 mg buccal, poor tolerance of VE Routine admission orders Epidural PRN  Plan developed w/ Dr. Etheleen Nicks MSN, CNM 06/24/2021 4:29 AM

## 2021-06-24 NOTE — MAU Note (Signed)
I was at my high risk doctor yesterday and my b/p was high. I did have a slight h/a and was told to come in if my headache continued. I slept yesterday but still have a slight h/a now. I woke up at 1800 and ate something spicy and sweet. I felt "2 stretches" after that. I took a bath and poked on baby but still not moving like usual. But now that I am here I am feeling him move. Denies VB or lOF.

## 2021-06-24 NOTE — MAU Provider Note (Addendum)
Chief Complaint:  Decreased Fetal Movement and Hypertension   Event Date/Time   First Provider Initiated Contact with Patient 06/24/21 0233     HPI: Tracy Keller is a 26 y.o. G1P0 at 44w3dwho presents to maternity admissions reporting decreased fetal movement since this evening.  Was noted to have elevated BP in MFM today.  Has had a mild headache all evening.   Scheduled for IOL this weekend. . She denies LOF, vaginal bleeding, dizziness, n/v, diarrhea, constipation or fever/chills.  She denies visual changes or RUQ abdominal pain.  Hypertension This is a new problem. The current episode started today. Associated symptoms include headaches. Pertinent negatives include no blurred vision, chest pain or shortness of breath. There are no associated agents to hypertension. There are no known risk factors for coronary artery disease. Past treatments include nothing. There are no compliance problems.    RN Note: I was at my high risk doctor yesterday and my b/p was high. I did have a slight h/a and was told to come in if my headache continued. I slept yesterday but still have a slight h/a now. I woke up at 1800 and ate something spicy and sweet. I felt "2 stretches" after that. I took a bath and poked on baby but still not moving like usual. But now that I am here I am feeling him move. Denies VB or lOF.   Past Medical History: Past Medical History:  Diagnosis Date   BMI 35.0-35.9,adult    History of idiopathic intracranial hypertension    treated with spinal tap   Intracranial hypertension    Medical history non-contributory     Past obstetric history: OB History  Gravida Para Term Preterm AB Living  1            SAB IAB Ectopic Multiple Live Births               # Outcome Date GA Lbr Len/2nd Weight Sex Delivery Anes PTL Lv  1 Current             Past Surgical History: Past Surgical History:  Procedure Laterality Date   WISDOM TOOTH EXTRACTION      Family History: Family History   Problem Relation Age of Onset   Pseudotumor cerebri Neg Hx     Social History: Social History   Tobacco Use   Smoking status: Never   Smokeless tobacco: Never  Vaping Use   Vaping Use: Never used  Substance Use Topics   Alcohol use: Never   Drug use: Never    Allergies:  Allergies  Allergen Reactions   Other     seasonal allergies    Meds:  Medications Prior to Admission  Medication Sig Dispense Refill Last Dose   ondansetron (ZOFRAN ODT) 4 MG disintegrating tablet Take 1 tablet (4 mg total) by mouth every 6 (six) hours as needed for nausea. 20 tablet 0    Prenatal MV & Min w/FA-DHA (PRENATAL ADULT GUMMY/DHA/FA PO) Take 2 tablets by mouth at bedtime.       I have reviewed patient's Past Medical Hx, Surgical Hx, Family Hx, Social Hx, medications and allergies.   ROS:  Review of Systems  Eyes:  Negative for blurred vision.  Respiratory:  Negative for shortness of breath.   Cardiovascular:  Negative for chest pain.  Neurological:  Positive for headaches.  Other systems negative  Physical Exam  Patient Vitals for the past 24 hrs:  BP Temp Pulse Resp SpO2 Height Weight  06/24/21 0215  129/76 98.3 F (36.8 C) 100 18 99 % 5\' 3"  (1.6 m) 114.8 kg   Vitals:   06/24/21 0300 06/24/21 0315 06/24/21 0330 06/24/21 0400  BP: (!) 149/78 (!) 154/69 138/69 119/70  Pulse: 94 96 (!) 103 (!) 107  Resp:      Temp:      SpO2: 100% 99% 98% 99%  Weight:      Height:        Constitutional: Well-developed, well-nourished female in no acute distress.  Cardiovascular: normal rate and rhythm Respiratory: normal effort, clear to auscultation bilaterally GI: Abd soft, non-tender, gravid appropriate for gestational age.   No rebound or guarding. MS: Extremities nontender, no edema, normal ROM Neurologic: Alert and oriented x 4.  GU: Neg CVAT.  FHT:  Baseline 140 , moderate variability, accelerations present, no decelerations Contractions:  Irregular     Labs: Results for orders  placed or performed during the hospital encounter of 06/24/21 (from the past 24 hour(s))  Urinalysis, Routine w reflex microscopic Urine, Clean Catch     Status: Abnormal   Collection Time: 06/24/21  2:25 AM  Result Value Ref Range   Color, Urine YELLOW YELLOW   APPearance HAZY (A) CLEAR   Specific Gravity, Urine 1.026 1.005 - 1.030   pH 6.0 5.0 - 8.0   Glucose, UA NEGATIVE NEGATIVE mg/dL   Hgb urine dipstick NEGATIVE NEGATIVE   Bilirubin Urine NEGATIVE NEGATIVE   Ketones, ur NEGATIVE NEGATIVE mg/dL   Protein, ur 30 (A) NEGATIVE mg/dL   Nitrite NEGATIVE NEGATIVE   Leukocytes,Ua NEGATIVE NEGATIVE   RBC / HPF 0-5 0 - 5 RBC/hpf   WBC, UA 0-5 0 - 5 WBC/hpf   Bacteria, UA NONE SEEN NONE SEEN   Squamous Epithelial / LPF 0-5 0 - 5   Mucus PRESENT   Protein / creatinine ratio, urine     Status: None   Collection Time: 06/24/21  2:34 AM  Result Value Ref Range   Creatinine, Urine 230.77 mg/dL   Total Protein, Urine 25 mg/dL   Protein Creatinine Ratio 0.11 0.00 - 0.15 mg/mg[Cre]  CBC     Status: Abnormal   Collection Time: 06/24/21  2:56 AM  Result Value Ref Range   WBC 11.7 (H) 4.0 - 10.5 K/uL   RBC 3.67 (L) 3.87 - 5.11 MIL/uL   Hemoglobin 8.5 (L) 12.0 - 15.0 g/dL   HCT 28.6 (L) 36.0 - 46.0 %   MCV 77.9 (L) 80.0 - 100.0 fL   MCH 23.2 (L) 26.0 - 34.0 pg   MCHC 29.7 (L) 30.0 - 36.0 g/dL   RDW 16.4 (H) 11.5 - 15.5 %   Platelets 263 150 - 400 K/uL   nRBC 0.0 0.0 - 0.2 %  Comprehensive metabolic panel     Status: Abnormal   Collection Time: 06/24/21  2:56 AM  Result Value Ref Range   Sodium 132 (L) 135 - 145 mmol/L   Potassium 3.9 3.5 - 5.1 mmol/L   Chloride 105 98 - 111 mmol/L   CO2 21 (L) 22 - 32 mmol/L   Glucose, Bld 90 70 - 99 mg/dL   BUN 7 6 - 20 mg/dL   Creatinine, Ser 0.53 0.44 - 1.00 mg/dL   Calcium 8.8 (L) 8.9 - 10.3 mg/dL   Total Protein 5.6 (L) 6.5 - 8.1 g/dL   Albumin 2.6 (L) 3.5 - 5.0 g/dL   AST 18 15 - 41 U/L   ALT 14 0 - 44 U/L   Alkaline Phosphatase 106  38 -  126 U/L   Total Bilirubin 0.4 0.3 - 1.2 mg/dL   GFR, Estimated >60 >60 mL/min   Anion gap 6 5 - 15    B/Negative/-- (07/05 0000)  Imaging:    MAU Course/MDM: I have ordered labs and reviewed results. Labs are normal except for low hemoglobin NST reviewed, reactive Consult Dr Elly Modena and Dr Mancel Bale with presentation, exam findings and test results. They recommend admission and induction of labor Treatments in MAU included EFM.    Pt informed that the ultrasound is considered a limited OB ultrasound and is not intended to be a complete ultrasound exam.  Patient also informed that the ultrasound is not being completed with the intent of assessing for fetal or placental anomalies or any pelvic abnormalities.  Explained that the purpose of todays ultrasound is to assess for presentation.  Patient acknowledges the purpose of the exam and the limitations of the study.   Fetus Is vertex  Assessment: S[place] IUP at [redacted]w[redacted]d Gestational Hypertension  Plan: Admit to Labor and Delivery Routine orders [facility] to follow.  Hansel Feinstein CNM, MSN Certified Nurse-Midwife 06/24/2021 2:33 AM

## 2021-06-25 DIAGNOSIS — D509 Iron deficiency anemia, unspecified: Secondary | ICD-10-CM | POA: Diagnosis present

## 2021-06-25 MED ORDER — SODIUM CHLORIDE 0.9 % IV SOLN
3.0000 g | Freq: Once | INTRAVENOUS | Status: DC
  Filled 2021-06-25 (×2): qty 8

## 2021-06-25 MED ORDER — TRANEXAMIC ACID-NACL 1000-0.7 MG/100ML-% IV SOLN
1000.0000 mg | Freq: Once | INTRAVENOUS | Status: AC
  Administered 2021-06-25: 1000 mg via INTRAVENOUS
  Filled 2021-06-25: qty 100

## 2021-06-25 MED ORDER — SIMETHICONE 80 MG PO CHEW: 80.0000 mg | CHEWABLE_TABLET | ORAL | Status: DC | PRN

## 2021-06-25 MED ORDER — LACTATED RINGERS IV SOLN: INTRAVENOUS | Status: DC

## 2021-06-25 MED ORDER — TETANUS-DIPHTH-ACELL PERTUSSIS 5-2.5-18.5 LF-MCG/0.5 IM SUSY: 0.5000 mL | PREFILLED_SYRINGE | Freq: Once | INTRAMUSCULAR | Status: DC

## 2021-06-25 MED ORDER — ONDANSETRON HCL 4 MG PO TABS: 4.0000 mg | ORAL_TABLET | ORAL | Status: DC | PRN

## 2021-06-25 MED ORDER — ACETAMINOPHEN 500 MG PO TABS
1000.0000 mg | ORAL_TABLET | Freq: Four times a day (QID) | ORAL | Status: DC | PRN
  Administered 2021-06-25: 1000 mg via ORAL
  Filled 2021-06-25: qty 2

## 2021-06-25 MED ORDER — ZOLPIDEM TARTRATE 5 MG PO TABS: 5.0000 mg | ORAL_TABLET | Freq: Every evening | ORAL | Status: DC | PRN

## 2021-06-25 MED ORDER — ACETAMINOPHEN 325 MG PO TABS: 650.0000 mg | ORAL_TABLET | ORAL | Status: DC | PRN

## 2021-06-25 MED ORDER — LIDOCAINE HCL (PF) 1 % IJ SOLN
INTRAMUSCULAR | Status: DC | PRN
  Administered 2021-06-25: 6 mL via EPIDURAL
  Administered 2021-06-25: 4 mL via EPIDURAL

## 2021-06-25 MED ORDER — IBUPROFEN 600 MG PO TABS
600.0000 mg | ORAL_TABLET | Freq: Four times a day (QID) | ORAL | Status: DC
  Administered 2021-06-25 – 2021-06-27 (×7): 600 mg via ORAL
  Filled 2021-06-25 (×7): qty 1

## 2021-06-25 MED ORDER — PRENATAL MULTIVITAMIN CH
1.0000 | ORAL_TABLET | Freq: Every day | ORAL | Status: DC
  Administered 2021-06-26 – 2021-06-27 (×2): 1 via ORAL
  Filled 2021-06-25 (×2): qty 1

## 2021-06-25 MED ORDER — ACETAMINOPHEN 325 MG PO TABS
650.0000 mg | ORAL_TABLET | ORAL | Status: DC | PRN
  Administered 2021-06-26: 650 mg via ORAL
  Filled 2021-06-25: qty 2

## 2021-06-25 MED ORDER — WITCH HAZEL-GLYCERIN EX PADS: 1.0000 "application " | MEDICATED_PAD | CUTANEOUS | Status: DC | PRN

## 2021-06-25 MED ORDER — OXYCODONE-ACETAMINOPHEN 5-325 MG PO TABS: 2.0000 | ORAL_TABLET | ORAL | Status: DC | PRN

## 2021-06-25 MED ORDER — OXYTOCIN-SODIUM CHLORIDE 30-0.9 UT/500ML-% IV SOLN: 2.5000 [IU]/h | INTRAVENOUS | Status: DC | PRN

## 2021-06-25 MED ORDER — DIBUCAINE (PERIANAL) 1 % EX OINT: 1.0000 "application " | TOPICAL_OINTMENT | CUTANEOUS | Status: DC | PRN

## 2021-06-25 MED ORDER — BENZOCAINE-MENTHOL 20-0.5 % EX AERO
1.0000 "application " | INHALATION_SPRAY | CUTANEOUS | Status: DC | PRN
  Administered 2021-06-25: 1 via TOPICAL
  Filled 2021-06-25: qty 56

## 2021-06-25 MED ORDER — COCONUT OIL OIL
1.0000 "application " | TOPICAL_OIL | Status: DC | PRN
  Administered 2021-06-25: 1 via TOPICAL

## 2021-06-25 MED ORDER — DIPHENHYDRAMINE HCL 25 MG PO CAPS: 25.0000 mg | ORAL_CAPSULE | Freq: Four times a day (QID) | ORAL | Status: DC | PRN

## 2021-06-25 MED ORDER — SENNOSIDES-DOCUSATE SODIUM 8.6-50 MG PO TABS
2.0000 | ORAL_TABLET | Freq: Every day | ORAL | Status: DC
  Administered 2021-06-26 – 2021-06-27 (×2): 2 via ORAL
  Filled 2021-06-25 (×2): qty 2

## 2021-06-25 MED ORDER — ONDANSETRON HCL 4 MG/2ML IJ SOLN: 4.0000 mg | INTRAMUSCULAR | Status: DC | PRN

## 2021-06-25 MED ORDER — OXYCODONE-ACETAMINOPHEN 5-325 MG PO TABS: 1.0000 | ORAL_TABLET | ORAL | Status: DC | PRN

## 2021-06-25 MED ORDER — SODIUM CHLORIDE 0.9 % IV SOLN
3.0000 g | Freq: Four times a day (QID) | INTRAVENOUS | Status: DC
  Administered 2021-06-25 – 2021-06-26 (×2): 3 g via INTRAVENOUS
  Filled 2021-06-25 (×2): qty 8

## 2021-06-25 NOTE — Progress Notes (Signed)
MD LABOR PROGRESS NOTE  Subjective:   Patient comfortable with epidural  Objective:    VS: BP (!) 147/86    Pulse 91    Temp 98.6 F (37 C) (Oral)    Resp 16    Ht 5\' 3"  (1.6 m)    Wt 114.8 kg    LMP 09/21/2020 (Exact Date)    SpO2 99%    BMI 44.82 kg/m  FHR : baseline 130 / variability moderate / accelerations present / absent decelerations Toco: contractions every 2 minutes / MVU 140-180 Dilation: 9.0 Effacement (%): 100 Station: 0 Presentation: Vertex Exam by:: dr Alwyn Pea Pitocin 14 mU/min  Assessment/Plan:   26 y.o. G1P0 [redacted]w[redacted]d admitted for IOL for gestational hypertension In active labor approaching stage 2 of labor -Will continue to increase pitocin Fetal Wellbeing Category I Pain is controlled with epidural Anticipate NSVD   Sanjuana Kava MD 06/25/2021 2:03 PM

## 2021-06-25 NOTE — Progress Notes (Signed)
MD LABOR PROGRESS NOTE  Subjective:   Patient feeling more pressure  Objective:    VS: BP (!) 150/96    Pulse (!) 105    Temp 97.7 F (36.5 C) (Oral)    Resp 16    Ht 5\' 3"  (1.6 m)    Wt 114.8 kg    LMP 09/21/2020 (Exact Date)    SpO2 99%    BMI 44.82 kg/m  FHR : baseline 150 / variability moderate / accelerations present with scalp stim / early variable decelerations with contractions Toco: contractions every 3 minutes / MVU 140 Dilation: 10 Dilation Complete Date: 06/25/21 Dilation Complete Time: 1648 Effacement (%): 100 Cervical Position: Posterior Station: Plus 1 Presentation: Vertex Exam by:: dr [email or web address] Pitocin 24 mU/min  Assessment/Plan:   26 y.o. G1P0 [redacted]w[redacted]d now in stage 2 of labor Pushing started Maternal effort good.  Comfortable with epidural   Anticipated MOD:  NSVD  [redacted]w[redacted]d MD 06/25/2021 5:15 PM

## 2021-06-25 NOTE — Progress Notes (Addendum)
Subjective:    Comfortable w/ epidural. Partner present and supportive. Agrees IUPC to IUPC placement.   Objective:    VS: BP 135/80    Pulse 94    Temp 98.2 F (36.8 C) (Oral)    Resp 17    Ht 5\' 3"  (1.6 m)    Wt 114.8 kg    LMP 09/21/2020 (Exact Date)    SpO2 99%    BMI 44.82 kg/m  FHR : baseline 140 / variability moderate / accelerations present / absent decelerations IUPC: contractions every 3 minutes / MVU 200 Membranes: SROM x7 hrs, remains clear Dilation: 2 Effacement (%): 70 Cervical Position: Posterior Station: -2 Presentation: Vertex Exam by:: Clois Dupes CNM Pitocin 4 mU/min  Assessment/Plan:   26 y.o. G1P0 [redacted]w[redacted]d  IOL for GHTN    -mild to moderate range BP, negative neuro symptoms   Labor:  S/P buccal misoprostol x 4 doses, SROM clear fluid, Pitocin infusing now, IUPC placed to evaluate uterine activity Fetal Wellbeing:  Category I Pain Control:  epidural I/D:   GBS pos, PCN x 5 doses Anticipated MOD:  NSVD  Arrie Eastern MSN, CNM 06/25/2021 1:46 AM

## 2021-06-25 NOTE — Progress Notes (Signed)
MD LABOR PROGRESS NOTE  Tracy Keller is a 26 y.o. G1P0 at [redacted]w[redacted]d admitted for induction of labor due to gestational hypertension.  Subjective: Patient comfortable with epidural  Objective: BP (!) 142/79    Pulse 91    Temp 98.6 F (37 C) (Oral)    Resp 18    Ht 5\' 3"  (1.6 m)    Wt 114.8 kg    LMP 09/21/2020 (Exact Date)    SpO2 99%    BMI 44.82 kg/m  I/O last 3 completed shifts: In: -  Out: 450 [Urine:450] No intake/output data recorded.  FHT:  FHR: 135 bpm, variability: moderate,  accelerations:  Present,  decelerations:  Absent UC:   regular, every 2 minutes SVE:   Dilation: 5.5 Effacement (%): 90 Station: -1 Exam by:: dr Jameek Bruntz MVU:180  Labs: Lab Results  Component Value Date   WBC 14.9 (H) 06/24/2021   HGB 8.9 (L) 06/24/2021   HCT 28.6 (L) 06/24/2021   MCV 76.9 (L) 06/24/2021   PLT 257 06/24/2021    Assessment / Plan: Induction of labor due to gestational hypertension,  progressing well on pitocin  Labor:  continue pitocin augmentation IUPC replaced Fetal Wellbeing:  Category I Pain Control:  Epidural I/D:  n/a Anticipated MOD:  NSVD  Sanjuana Kava MD 06/25/2021, 12:05 PM

## 2021-06-25 NOTE — Discharge Summary (Signed)
SVD OB Discharge Summary     Patient Name: Tracy Keller DOB: Dec 05, 1995 MRN: [id number]  Date of admission: 06/24/2021 Delivering MD: Essie Hart  Date of delivery: 06/25/2021 Type of delivery: SVD  Newborn Data: Sex: Baby female Circumcision: done 1/28 Live born adult  Birth Weight:   APGAR: 4, 8   Newborn Delivery   Birth date/time:  Delivery type:       Feeding: breast and bottle Infant being discharge to home with mother in stable condition.   Admitting diagnosis: Gestational hypertension [O13.9] Intrauterine pregnancy: [redacted]w[redacted]d     Secondary diagnosis:  Principal Problem:   Gestational hypertension Active Problems:   Benign intracranial hypertension   Rh negative, maternal   Group beta Strep positive   History of papilledema   Normal postpartum course   SVD (spontaneous vaginal delivery)   Iron deficiency anemia                                Complications: None                                                              Intrapartum Procedures: spontaneous vaginal delivery and GBS prophylaxis Postpartum Procedures: Rho(D) Ig Complications-Operative and Postpartum:  2nd degree perineal laceration Augmentation: Pitocin and Cytotec   History of Present Illness: [name] is a 26 y.o. female, G1P0, who presents at [redacted]w[redacted]d weeks gestation. The patient has been followed at  [facility]  Her pregnancy has been complicated by:  Patient Active Problem List   Diagnosis Date Noted   Normal postpartum course 06/25/2021   SVD (spontaneous vaginal delivery) 06/25/2021   Iron deficiency anemia 06/25/2021   Gestational hypertension 06/24/2021   Benign intracranial hypertension 06/24/2021   Rh negative, maternal 06/24/2021   Group beta Strep positive 06/24/2021   History of papilledema 06/24/2021   Pseudotumor cerebri syndrome 02/01/2021     Active Ambulatory Problems    Diagnosis Date Noted   Pseudotumor cerebri syndrome  02/01/2021   Resolved Ambulatory Problems    Diagnosis Date Noted   BMI 35.0-35.9,adult    Headache 01/31/2021   [redacted] weeks gestation of pregnancy    Past Medical History:  Diagnosis Date   Gestational hypertension    History of idiopathic intracranial hypertension    Intracranial hypertension      Hospital course:  Induction of Labor With Vaginal Delivery   26 y.o. yo G1P0 at [redacted]w[redacted]d was admitted to the hospital 06/24/2021 for induction of labor.  Indication for induction: Gestational hypertension.  Patient had an uncomplicated labor course as follows: Membrane Rupture Time/Date: 7:04 PM ,06/24/2021   Delivery Method:Vaginal, Spontaneous  Episiotomy: None  Lacerations:  2nd degree;Periurethral;Vaginal  Details of delivery can be found in separate delivery note.  Patient had a routine postpartum course. Patient is discharged home 06/27/21.  Postpartum Day #2 : S/P NSVD due to pt was admitted on 1/25 for IOL for GHTN, admitting PCR was 0.11, other labs unremarkable, except starting hgb of 8.5-7.2 1/27 IV Iron, progressed with cytotec, and pitocin to SVD on 1/26 over 2nd laceration with shoulder dystocia pernium, with ebl of 300 . BP currently 150/90s, started procardia 60XL 1/28, will watch for decrease in  BP then pt may be discharged. Still asymptomatic, pt is stressed about baby and crying, baby finished circ but needs to stay for billi lights. Pt has strong desire to go home, denies HA, RUQ pain or vision changes. GBS+ and tx 9 doses. H/O ICH started at 34 weeks with papilledema. RH-.  Patient up ad lib, denies syncope or dizziness. Reports consuming regular diet without issues and denies N/V. Patient reports 0 bowel movement + passing flatus.  Denies issues with urination and reports bleeding is "lighter."  Patient is breastfeeding and reports going well.  Desires undecided for postpartum contraception.  Pain is being appropriately managed with use of po meds.   Newborn Data: Birth  date:06/25/2021  Birth time:6:43 PM  Gender:Female  Living status:Living  Apgars:4 ,8  Weight:3740 g      Physical exam  Vitals:   06/27/21 0449 06/27/21 0809 06/27/21 0810 06/27/21 1048  BP: 131/60 (!) 150/68  (!) 145/62  Pulse: (!) 118 (!) 117  (!) 121  Resp: 17 18    Temp: 98.2 F (36.8 C) 98.4 F (36.9 C)    TempSrc: Oral Oral    SpO2: 100% 99% 99%   Weight:      Height:       General: alert, cooperative, and no distress Lochia: appropriate Uterine Fundus: firm Incision: Healing well with no significant drainage, No significant erythema, Dressing is clean, dry, and intact, honeycomb dressing CDI Perineum: approximate DVT Evaluation: No evidence of DVT seen on physical exam. Negative Homan's sign. No cords or calf tenderness. No significant calf/ankle edema.  Labs: Lab Results  Component Value Date   WBC 22.2 (H) 06/26/2021   HGB 7.2 (L) 06/26/2021   HCT 23.8 (L) 06/26/2021   MCV 78.3 (L) 06/26/2021   PLT 288 06/26/2021   CMP Latest Ref Rng & Units 06/24/2021  Glucose 70 - 99 mg/dL 90  BUN 6 - 20 mg/dL 7  Creatinine 7.89 - 3.81 mg/dL 0.17  Sodium 510 - 258 mmol/L 132(L)  Potassium 3.5 - 5.1 mmol/L 3.9  Chloride 98 - 111 mmol/L 105  CO2 22 - 32 mmol/L 21(L)  Calcium 8.9 - 10.3 mg/dL 5.2(D)  Total Protein 6.5 - 8.1 g/dL 7.8(E)  Total Bilirubin 0.3 - 1.2 mg/dL 0.4  Alkaline Phos 38 - 126 U/L 106  AST 15 - 41 U/L 18  ALT 0 - 44 U/L 14    Date of discharge: 06/27/2021 Discharge Diagnoses: Term Pregnancy-delivered and GHTN Discharge instruction: per After Visit Summary and "Baby and Me Booklet".  After visit meds:   Activity:           unrestricted and pelvic rest Advance as tolerated. Pelvic rest for 6 weeks.  Diet:                routine Medications: PNV, Ibuprofen, Colace, and Iron Postpartum contraception: Undecided Condition:  Pt discharge to home with baby in stable and condition Anemia: PO Iron  GHTN: 1 week BP check.   Meds: Allergies as of  06/27/2021   No Known Allergies      Medication List     TAKE these medications    ibuprofen 600 MG tablet Commonly known as: ADVIL Take 1 tablet (600 mg total) by mouth every 6 (six) hours.   NIFEdipine 60 MG 24 hr tablet Commonly known as: ADALAT CC Take 1 tablet (60 mg total) by mouth daily. Start taking on: June 28, 2021   ondansetron 4 MG disintegrating tablet Commonly known as: Zofran  ODT Take 1 tablet (4 mg total) by mouth every 6 (six) hours as needed for nausea.   PRENATAL ADULT GUMMY/DHA/FA PO Take 2 tablets by mouth at bedtime.        Discharge Follow Up:   Follow-up Information     [facility]. Schedule an appointment as soon as possible for a visit in 1 week(s).   Specialty: Obstetrics and Gynecology Why: 1 week BP check and 6 weeks PPV. Contact information: [address] [place] [phone]                 [place] FarmingtonJade Whittaker Lenis, NP-C, [place] 06/27/2021, 2:04 PM  Dale DurhamJade Khi Mcmillen, FNP

## 2021-06-25 NOTE — Anesthesia Procedure Notes (Signed)
Epidural Patient location during procedure: OB Start time: 06/24/2021 11:50 PM End time: 06/25/2021 12:04 AM  Staffing Anesthesiologist: Lucretia Kern, MD Performed: anesthesiologist   Preanesthetic Checklist Completed: patient identified, IV checked, risks and benefits discussed, monitors and equipment checked, pre-op evaluation and timeout performed  Epidural Patient position: sitting Prep: DuraPrep Patient monitoring: heart rate, continuous pulse ox and blood pressure Approach: midline Location: L3-L4 Injection technique: LOR air  Needle:  Needle type: Tuohy  Needle gauge: 17 G Needle length: 9 cm Needle insertion depth: 9 cm Catheter type: closed end flexible Catheter size: 19 Gauge Catheter at skin depth: 14 cm Test dose: negative  Assessment Events: blood not aspirated, injection not painful, no injection resistance, no paresthesia and negative IV test  Additional Notes Reason for block:procedure for pain

## 2021-06-25 NOTE — Progress Notes (Signed)
Subjective: Postpartum Day # 1 : S/P NSVD due to pt was admitted on 1/25 for IOL for GHTN, admitting PCR was 0.11, other labs unremarkable, except starting hgb of 8.5-7.2 1/27 IV Iron, progressed with cytotec, and pitocin to SVD on 1/26 over 2nd laceration with shoulder dystocia pernium, with ebl of 300 . BP currently 122/63, denies HA, RUQ pain or vision changes. GBS+ and tx 9 doses. H/O ICH started at 34 weeks with papilledema. RH-.  Patient up ad lib, denies syncope or dizziness. Reports consuming regular diet without issues and denies N/V. Patient reports 0 bowel movement + passing flatus.  Denies issues with urination and reports bleeding is "lighter."  Patient is breastfeeding and reports going well.  Desires undecided for postpartum contraception.  Pain is being appropriately managed with use of po meds.   2nd laceration Feeding:  Breast Contraceptive plan:  undecided BB: Circ in pt desired  Objective: Vital signs in last 24 hours: Patient Vitals for the past 24 hrs:  BP Temp Temp src Pulse Resp SpO2  06/26/21 0800 (!) 141/73 98.1 F (36.7 C) Oral (!) 118 16 100 %  06/26/21 0603 122/63 98.9 F (37.2 C) Oral (!) 113 18 100 %  06/26/21 0205 129/71 98.6 F (37 C) Oral (!) 115 18 --  06/25/21 2211 132/63 99 F (37.2 C) Oral 100 18 --  06/25/21 2115 136/70 99.6 F (37.6 C) Oral (!) 102 18 98 %  06/25/21 2000 139/62 -- -- 97 -- --  06/25/21 1911 137/70 -- -- 100 18 --  06/25/21 1830 137/74 -- -- 84 -- --  06/25/21 1809 -- (!) 100.4 F (38 C) Axillary -- -- --  06/25/21 1730 -- (!) 97.5 F (36.4 C) Oral -- -- --  06/25/21 1702 (!) 150/96 -- -- (!) 105 -- --  06/25/21 1631 139/67 -- -- 84 16 --  06/25/21 1601 133/64 -- -- 92 18 --  06/25/21 1538 -- -- -- -- 18 --  06/25/21 1531 (!) 142/75 -- -- (!) 110 -- --  06/25/21 1501 138/79 -- -- 97 16 --  06/25/21 1431 (!) 153/83 97.7 F (36.5 C) Oral 95 18 --  06/25/21 1401 (!) 147/86 -- -- 91 -- --  06/25/21 1347 140/69 -- -- 92 -- --   06/25/21 1301 122/67 -- -- 86 -- --  06/25/21 1231 135/65 -- -- 82 16 --  06/25/21 1201 136/67 -- -- 86 18 --  06/25/21 1131 (!) 142/79 -- -- 91 18 --  06/25/21 1101 131/70 98.6 F (37 C) Oral 91 -- --  06/25/21 1032 137/61 -- -- 90 18 --  06/25/21 1003 (!) 144/80 -- -- (!) 101 -- --  06/25/21 0931 (!) 141/68 -- -- 93 16 --     Physical Exam:  General: alert, cooperative, and appears stated age Mood/Affect: Happy Lungs: clear to auscultation, no wheezes, rales or rhonchi, symmetric air entry.  Heart: normal rate, regular rhythm, normal S1, S2, no murmurs, rubs, clicks or gallops. Breast: breasts appear normal, no suspicious masses, no skin or nipple changes or axillary nodes. Abdomen:  + bowel sounds, soft, non-tender GU: perineum approximate, healing well. No signs of external hematomas.  Uterine Fundus: firm Lochia: appropriate Skin: Warm, Dry. DVT Evaluation: No evidence of DVT seen on physical exam. Negative Homan's sign. No significant calf/ankle edema.  CBC Latest Ref Rng & Units 06/26/2021 06/24/2021 06/24/2021  WBC 4.0 - 10.5 K/uL 22.2(H) 14.9(H) 11.7(H)  Hemoglobin 12.0 - 15.0 g/dL 7.2(L) 8.9(L) 8.5(L)  Hematocrit 36.0 - 46.0 % 23.8(L) 28.6(L) 28.6(L)  Platelets 150 - 400 K/uL 288 257 263    Results for orders placed or performed during the hospital encounter of 06/24/21 (from the past 24 hour(s))  CBC     Status: Abnormal   Collection Time: 06/26/21  4:50 AM  Result Value Ref Range   WBC 22.2 (H) 4.0 - 10.5 K/uL   RBC 3.04 (L) 3.87 - 5.11 MIL/uL   Hemoglobin 7.2 (L) 12.0 - 15.0 g/dL   HCT 23.8 (L) 36.0 - 46.0 %   MCV 78.3 (L) 80.0 - 100.0 fL   MCH 23.7 (L) 26.0 - 34.0 pg   MCHC 30.3 30.0 - 36.0 g/dL   RDW 17.0 (H) 11.5 - 15.5 %   Platelets 288 150 - 400 K/uL   nRBC 0.0 0.0 - 0.2 %     CBG (last 3)  No results for input(s): GLUCAP in the last 72 hours.   I/O last 3 completed shifts: In: 360 [P.O.:360] Out: 1450 [Urine:1150; Blood:300]    Assessment Postpartum Day # 1 : S/P NSVD due to pt was admitted on 1/25 for IOL for GHTN, admitting PCR was 0.11, other labs unremarkable, except starting hgb of 8.5-7.2 1/27 IV Iron, progressed with cytotec, and pitocin to SVD on 1/26 over 2nd laceration with shoulder dystocia pernium, with ebl of 300 . BP currently 122/63, denies HA, RUQ pain or vision changes. GBS+ and tx 9 doses. H/O ICH started at 34 weeks with papilledema. RH-.  Patient up ad lib, denies syncope or dizziness. Reports consuming regular diet without issues and denies N/V. Patient reports 0 bowel movement + passing flatus.  Denies issues with urination and reports bleeding is "lighter."  Patient is breastfeeding and reports going well.  Desires undecided for postpartum contraception.  Pain is being appropriately managed with use of po meds. Pt stable. -1 involution. breastfeeding. Hemodynamically stable.   Plan: Continue other mgmt as ordered VTE prophylactics: Early ambulated as tolerates.  GHTN: Monitor BP. If BP >140/90s will start procardia 30mg  xl daily.  Anemia: IV venofer today 500. Baby Female: Desires in pt circ prior to discharge tomorrow.  Pain control: Motrin/Tylenol PRN Education given regarding options for contraception, including barrier methods, injectable contraception, IUD placement, oral contraceptives.  Plan for discharge tomorrow and Breastfeeding  Dr. Alesia Richards to be updated on patient status  [facility] NP-C, CNM 06/26/2021, 9:23 AM

## 2021-06-25 NOTE — Progress Notes (Signed)
Subjective:    Comfortable w/epidural and able to rest.   Objective:    VS: BP 132/64    Pulse 86    Temp 98.6 F (37 C) (Oral)    Resp 17    Ht 5\' 3"  (1.6 m)    Wt 114.8 kg    LMP 09/21/2020 (Exact Date)    SpO2 99%    BMI 44.82 kg/m  FHR : baseline 145 / variability moderate / accelerations present / absent decelerations IUPC: contractions every 1-2 minutes / MVU 240 Membranes: SROM x10 hrs, remains clear Dilation: 4.5 Effacement (%): 80 Cervical Position: Posterior Station: -2 Presentation: Vertex Exam by:: [email or web address], CNM Pitocin 12, reduced to 8 mU/min  Assessment/Plan:   26 y.o. G1P0 [redacted]w[redacted]d IOL for GHTN    -mild to moderate range BP after epidural  Labor:  progressing on Pitocin Fetal Wellbeing:  Category I Pain Control:  Epidural I/D:   GBS postive, PCN x6 doses Anticipated MOD:  NSVD  [redacted]w[redacted]d MSN, CNM 06/25/2021 5:54 AM

## 2021-06-26 LAB — CBC
HCT: 23.8 % — ABNORMAL LOW (ref 36.0–46.0)
Hemoglobin: 7.2 g/dL — ABNORMAL LOW (ref 12.0–15.0)
MCH: 23.7 pg — ABNORMAL LOW (ref 26.0–34.0)
MCHC: 30.3 g/dL (ref 30.0–36.0)
MCV: 78.3 fL — ABNORMAL LOW (ref 80.0–100.0)
Platelets: 288 10*3/uL (ref 150–400)
RBC: 3.04 MIL/uL — ABNORMAL LOW (ref 3.87–5.11)
RDW: 17 % — ABNORMAL HIGH (ref 11.5–15.5)
WBC: 22.2 10*3/uL — ABNORMAL HIGH (ref 4.0–10.5)
nRBC: 0 % (ref 0.0–0.2)

## 2021-06-26 MED ORDER — RHO D IMMUNE GLOBULIN 1500 UNIT/2ML IJ SOSY
300.0000 ug | PREFILLED_SYRINGE | Freq: Once | INTRAMUSCULAR | Status: AC
  Administered 2021-06-26: 300 ug via INTRAMUSCULAR
  Filled 2021-06-26: qty 2

## 2021-06-26 MED ORDER — SODIUM CHLORIDE 0.9 % IV SOLN
500.0000 mg | Freq: Once | INTRAVENOUS | Status: AC
  Administered 2021-06-26: 500 mg via INTRAVENOUS
  Filled 2021-06-26: qty 25

## 2021-06-26 NOTE — Lactation Note (Signed)
This note was copied from a baby's chart. Lactation Consultation Note  Patient Name: Tracy Keller JKDTO'I Date: 06/26/2021 Reason for consult: Follow-up assessment;1st time breastfeeding;Mother's request Age:24 hours  P1, Mother states she attempted latching x 2 with difficulty. Unwrapped baby and placed him in football hold and mother guided baby to breast with eagerness and he latched.  Intermittent swallows observed. Encouraged breastfeeding on both breasts and then if he is still showing cues, mother can supplement with colostrum.   Feeding Mother's Current Feeding Choice: Breast Milk  LATCH Score Latch: Grasps breast easily, tongue down, lips flanged, rhythmical sucking.  Audible Swallowing: A few with stimulation  Type of Nipple: Everted at rest and after stimulation  Comfort (Breast/Nipple): Soft / non-tender  Hold (Positioning): Assistance needed to correctly position infant at breast and maintain latch.  LATCH Score: 8   Lactation Tools Discussed/Used Tools: Pump Breast pump type: Double-Electric Breast Pump  Interventions Interventions: Breast feeding basics reviewed;Assisted with latch;Skin to skin;Education  Consult Status Consult Status: Follow-up Date: 06/27/21 Follow-up type: In-patient    Tracy Keller [facility] 06/26/2021, 1:32 PM

## 2021-06-26 NOTE — Lactation Note (Signed)
This note was copied from a baby's chart. Lactation Consultation Note  Patient Name: Tracy Keller EPPIR'J Date: 06/26/2021 Reason for consult: Initial assessment;1st time breastfeeding Age:24 hours  P1, Mother receiving iron.  Baby had not been fed since 0230. Mother hand expressed drops.  She has brought in syringes of colostrum she expressed from home. Reviewed waking techniques.  Unwrapped baby for feeding. Assisted with latching in football hold on L breast with intermittent swallows. Suggest giving colostrum to baby after feedings with either spoon or cup. Encouraged latching on demand 8-12 times per day. Pump as desired since baby is latching well and mother has stored colostrum. Mom made aware of O/P services, breastfeeding support groups, community resources, and our phone # for post-discharge questions.    Maternal Data Has patient been taught Hand Expression?: Yes Does the patient have breastfeeding experience prior to this delivery?: No  Feeding Mother's Current Feeding Choice: Breast Milk  LATCH Score Latch: Grasps breast easily, tongue down, lips flanged, rhythmical sucking.  Audible Swallowing: A few with stimulation  Type of Nipple: Everted at rest and after stimulation  Comfort (Breast/Nipple): Soft / non-tender  Hold (Positioning): Assistance needed to correctly position infant at breast and maintain latch.  LATCH Score: 8   Lactation Tools Discussed/Used Tools: Pump Breast pump type: Double-Electric Breast Pump Pump Education: Milk Storage Reason for Pumping: stimulation and supplementation Pumping frequency: PRN  Interventions Interventions: Breast feeding basics reviewed;Assisted with latch;Skin to skin;Hand express;Support pillows;Adjust position;Position options;Expressed milk;DEBP;Education;LC Services brochure  Discharge Pump:  (Motif)  Consult Status Consult Status: Follow-up Date: 06/27/21 Follow-up type:  In-patient    Dahlia Byes [facility] 06/26/2021, 9:13 AM

## 2021-06-26 NOTE — Anesthesia Postprocedure Evaluation (Signed)
Anesthesia Post Note  Patient: Tracy Keller  Procedure(s) Performed: AN AD [place]     Patient location during evaluation: Mother Baby Anesthesia Type: Epidural Level of consciousness: awake and alert and oriented Pain management: satisfactory to patient Vital Signs Assessment: post-procedure vital signs reviewed and stable Respiratory status: respiratory function stable Cardiovascular status: stable Postop Assessment: no headache, no backache, epidural receding, patient able to bend at knees, no signs of nausea or vomiting, adequate PO intake and able to ambulate Anesthetic complications: no   No notable events documented.  Last Vitals:  Vitals:   06/26/21 0802 06/26/21 1152  BP: (!) 141/73 (!) 145/75  Pulse: (!) 119 (!) 123  Resp:  17  Temp:  36.6 C  SpO2:  98%    Last Pain:  Vitals:   06/26/21 1152  TempSrc: Oral  PainSc:    Pain Goal: Patients Stated Pain Goal: 0 (06/24/21 0219)                 Katherina Mires

## 2021-06-27 LAB — RH IG WORKUP (INCLUDES ABO/RH)
Fetal Screen: NEGATIVE
Gestational Age(Wks): 39.4
Unit division: 0

## 2021-06-27 MED ORDER — NIFEDIPINE ER OSMOTIC RELEASE 60 MG PO TB24
60.0000 mg | ORAL_TABLET | Freq: Every day | ORAL | Status: DC
  Administered 2021-06-27: 60 mg via ORAL
  Filled 2021-06-27: qty 1

## 2021-06-27 MED ORDER — IBUPROFEN 600 MG PO TABS: 600.0000 mg | ORAL_TABLET | Freq: Four times a day (QID) | ORAL | 0 refills | Status: AC

## 2021-06-27 MED ORDER — NIFEDIPINE ER 60 MG PO TB24: 60.0000 mg | ORAL_TABLET | Freq: Every day | ORAL | 1 refills | Status: DC

## 2021-06-27 NOTE — Lactation Note (Signed)
This note was copied from a baby's chart. Lactation Consultation Note  Patient Name: Tracy Keller GGYIR'S Date: 06/27/2021 Reason for consult: Follow-up assessment Age:24 hours  P1, Baby sleeping. Mother has been breastfeeding, pumping and supplementing with her own milk. Mother will pump as she desires. Feed on demand with cues.  Goal 8-12+ times per day after first 24 hrs.  Place baby STS if not cueing.  Reviewed engorgement care and monitoring voids/stools. Mother denies questions or concerns.   Feeding Mother's Current Feeding Choice: Breast Milk  Lactation Tools Discussed/Used Tools: Pump Breast pump type: Double-Electric Breast Pump Pumped volume: 14 mL  Interventions Interventions: Breast feeding basics reviewed;DEBP;Education  Discharge Discharge Education: Engorgement and breast care;Warning signs for feeding baby  Consult Status Consult Status: Complete Date: 06/27/21    Dahlia Byes [facility] 06/27/2021, 8:21 AM

## 2021-06-27 NOTE — Social Work (Signed)
CSW received and acknowledges consult for a score of 8 on the [place] Postpartum Depression Scale  Consult screened out due to 8 on EDPS does not warrant a CSW consult.  MOB whom scores are greater than 9/yes to question 10 on [place] Postpartum Depression Screen warrants a CSW consult.   Darra Lis, [place] [employer] [phone]

## 2021-06-28 ENCOUNTER — Inpatient Hospital Stay (HOSPITAL_COMMUNITY)
Admission: AD | Admit: 2021-06-28 | Payer: Medicaid Other | Source: Home / Self Care | Admitting: Obstetrics & Gynecology

## 2021-06-28 ENCOUNTER — Inpatient Hospital Stay (HOSPITAL_COMMUNITY): Payer: Medicaid Other

## 2021-06-28 HISTORY — DX: Gestational (pregnancy-induced) hypertension without significant proteinuria, unspecified trimester: O13.9

## 2021-07-07 ENCOUNTER — Telehealth (HOSPITAL_COMMUNITY): Payer: Self-pay | Admitting: *Deleted

## 2021-07-07 NOTE — Telephone Encounter (Signed)
Patient reported that she has vomited once this evening. Stated, "I've had high blood pressure, but I've been checking and it's been in the 140s over 80s. I also noticed some blood in my stool this evening. I called my OB, and they told me to monitor my symptoms." RN encouraged patient to call OB back if symptoms worsen or to go to the ER if necessary. Patient voiced no other questions or concerns at this time. EPDS=7. Patient voiced no questions or concerns regarding infant at this time. Patient reports infant sleeps in a bassinet or in her bed on his back. RN reviewed ABCs of safe sleep. Patient verbalized understanding. Patient requested RN email information on hospital's virtual postpartum classes and support groups. Email sent. Erline Levine, RN, 07/07/21, 2003

## 2021-09-16 ENCOUNTER — Ambulatory Visit: Payer: Medicaid Other | Admitting: Diagnostic Neuroimaging

## 2021-11-09 ENCOUNTER — Ambulatory Visit
Admission: EM | Admit: 2021-11-09 | Discharge: 2021-11-09 | Disposition: A | Discharge status: Home / Self Care | Payer: Medicaid Other

## 2021-11-09 DIAGNOSIS — Z8669 Personal history of other diseases of the nervous system and sense organs: Secondary | ICD-10-CM

## 2021-11-09 DIAGNOSIS — R519 Headache, unspecified: Secondary | ICD-10-CM

## 2021-11-09 DIAGNOSIS — H539 Unspecified visual disturbance: Secondary | ICD-10-CM

## 2021-11-09 NOTE — ED Triage Notes (Signed)
Pt c/o frequent headache, vision changes and is seeing floaters. She states the headaches are worse after eating.  Started: a week ago

## 2021-11-09 NOTE — ED Provider Notes (Signed)
[facility]    CSN: [id number] Arrival date & time: 11/09/21  1611    HISTORY   Chief Complaint  Patient presents with   Headache   Dizziness   HPI Tracy Keller is a 26 y.o. female. Patient presents to urgent care complaining of frequent headaches that are sharp but not unbearable, intermittent vision changes and seeing floaters.  Patient states her headaches are usually worse after she eats and that they have reappeared about a week ago.  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  The history is provided by the patient.   Past Medical History:  Diagnosis Date   BMI 35.0-35.9,adult    Gestational hypertension    History of idiopathic intracranial hypertension    treated with spinal tap   Intracranial hypertension    Patient Active Problem List   Diagnosis Date Noted   Normal postpartum course 06/25/2021   SVD (spontaneous vaginal delivery) 06/25/2021   Iron deficiency anemia 06/25/2021   Gestational hypertension 06/24/2021   Benign intracranial hypertension 06/24/2021   Rh negative, maternal 06/24/2021   Group beta Strep positive 06/24/2021   History of papilledema 06/24/2021   Pseudotumor cerebri syndrome 02/01/2021   Past Surgical History:  Procedure Laterality Date   WISDOM TOOTH EXTRACTION     OB History     Gravida  1   Para      Term      Preterm      AB      Living         SAB      IAB      Ectopic      Multiple      Live Births             Home Medications    Prior to Admission medications   Medication Sig Start Date End Date Taking? Authorizing Provider  ibuprofen (ADVIL) 600 MG tablet Take 1 tablet (600 mg total) by mouth every 6 (six) hours. 06/27/21   [place], Lesly Rubenstein, FNP  norethindrone (MICRONOR) 0.35 MG tablet Take 1 tablet by mouth daily. 10/22/21   [provider]  ondansetron (ZOFRAN ODT) 4 MG disintegrating  tablet Take 1 tablet (4 mg total) by mouth every 6 (six) hours as needed for nausea. 11/20/20   Aviva Signs, CNM  Prenatal MV & Min w/FA-DHA (PRENATAL ADULT GUMMY/DHA/FA PO) Take 2 tablets by mouth at bedtime.    [provider]   Family History Family History  Problem Relation Age of Onset   Pseudotumor cerebri Neg Hx    Social History Social History   Tobacco Use   Smoking status: Never   Smokeless tobacco: Never  Vaping Use   Vaping Use: Never used  Substance Use Topics   Alcohol use: Never   Drug use: Never   Allergies   Patient has no known allergies.  Review of Systems Review of Systems Pertinent findings noted in history of present illness.   Physical Exam Triage Vital Signs ED Triage Vitals  Enc Vitals Group     BP 03/27/21 0827 (!) 147/82     Pulse Rate 03/27/21 0827 72     Resp 03/27/21 0827 18     Temp 03/27/21 0827 98.3 F (36.8 C)     Temp Source 03/27/21 0827 Oral     SpO2 03/27/21 0827 98 %     Weight --  Height --      Head Circumference --      Peak Flow --      Pain Score 03/27/21 0826 5     Pain Loc --      Pain Edu? --      Excl. in GC? --   No data found.  Updated Vital Signs BP 127/83 (BP Location: Right Arm)   Pulse 100   Temp 98.5 F (36.9 C) (Oral)   Resp 16   LMP 10/30/2021   SpO2 97%   Breastfeeding Yes   Physical Exam Vitals reviewed.     Visual Acuity Right Eye Distance:   Left Eye Distance:   Bilateral Distance:    Right Eye Near:   Left Eye Near:    Bilateral Near:     UC Couse / Diagnostics / Procedures:    EKG  Radiology No results found.  Procedures Procedures (including critical care time)  UC Diagnoses / Final Clinical Impressions(s)   I have reviewed the triage vital signs and the nursing notes.  Pertinent labs & imaging results that were available during my care of the patient were reviewed by me and considered in my medical decision making (see chart for details).   Final  diagnoses:  History of idiopathic intracranial hypertension  Increased frequency of headaches  Vision changes   Patient advised to strongly recommend that she follow-up with both her ophthalmologist and her neurologist for further evaluation and possible imaging.  No services were provided during this visit.  ED Prescriptions   None    PDMP not reviewed this encounter.  Pending results:  Labs Reviewed - No data to display  Medications Ordered in UC: Medications - No data to display  Disposition Upon Discharge:  Condition: stable for discharge home Home: take medications as prescribed; routine discharge instructions as discussed; follow up as advised.  Patient presented with an acute illness with associated systemic symptoms and significant discomfort requiring urgent management. In my opinion, this is a condition that a prudent lay person (someone who possesses an average knowledge of health and medicine) may potentially expect to result in complications if not addressed urgently such as respiratory distress, impairment of bodily function or dysfunction of bodily organs.   Routine symptom specific, illness specific and/or disease specific instructions were discussed with the patient and/or caregiver at length.   As such, the patient has been evaluated and assessed, work-up was performed and treatment was provided in alignment with urgent care protocols and evidence based medicine.  Patient/parent/caregiver has been advised that the patient may require follow up for further testing and treatment if the symptoms continue in spite of treatment, as clinically indicated and appropriate.  If the patient was tested for COVID-19, Influenza and/or RSV, then the patient/parent/guardian was advised to isolate at home pending the results of his/her diagnostic coronavirus test and potentially longer if they're positive. I have also advised pt that if his/her COVID-19 test returns positive, it's  recommended to self-isolate for at least 10 days after symptoms first appeared AND until fever-free for 24 hours without fever reducer AND other symptoms have improved or resolved. Discussed self-isolation recommendations as well as instructions for household member/close contacts as per the [facility] and [place] [facility], and also gave patient the COVID packet with this information.  Patient/parent/caregiver has been advised to return to the [facility] or PCP in 3-5 days if no better; to PCP or the Emergency Department if new signs and symptoms develop, or if the current  signs or symptoms continue to change or worsen for further workup, evaluation and treatment as clinically indicated and appropriate  The patient will follow up with their current PCP if and as advised. If the patient does not currently have a PCP we will assist them in obtaining one.   The patient may need specialty follow up if the symptoms continue, in spite of conservative treatment and management, for further workup, evaluation, consultation and treatment as clinically indicated and appropriate.  Patient/parent/caregiver verbalized understanding and agreement of plan as discussed.  All questions were addressed during visit.  Please see discharge instructions below for further details of plan.  Discharge Instructions: Discharge Instructions   None     This office note has been dictated using Dragon speech recognition software.  Unfortunately, and despite my best efforts, this method of dictation can sometimes lead to occasional typographical or grammatical errors.  I apologize in advance if this occurs.     Theadora RamaMorgan, Aylana Hirschfeld Scales, PA-C 11/09/21 1801

## 2021-11-10 ENCOUNTER — Telehealth: Payer: Self-pay | Admitting: Diagnostic Neuroimaging

## 2021-11-10 NOTE — Telephone Encounter (Signed)
Called patient who stated her headaches aren't the same as before; she does have occasional sharp pain, but not a lot. She feels light headed, no vision changes. Her headaches used to occur at night but now are during the day. She is unsure if it is due to increased fluid in her head, less sleep due to new baby, stress. She is breast feeling full time and on birth control now. She saw Dr Genia Del end of May, and he stated swelling has gone down significantly. She will call his office tomorrow to see if she needs to be seen again.   She stated after LP  last Sept she was doing much better till this past week. I advised will send to Dr Marjory Lies and call her back. Patient verbalized understanding, appreciation.

## 2021-11-10 NOTE — Telephone Encounter (Signed)
Pt called wanting to see the provider regarding the "weird" headaches she has been having. Pt was scheduled to see provider on the 27th but she is wanting to speak to the RN as well. Please advise.

## 2021-11-12 NOTE — Telephone Encounter (Signed)
Called patient and informed her Dr Marjory Lies stated he will have to see her in clinic for evaluation of headaches.  Patient verbalized understanding, appreciation.

## 2021-11-24 ENCOUNTER — Ambulatory Visit (INDEPENDENT_AMBULATORY_CARE_PROVIDER_SITE_OTHER): Payer: Medicaid Other | Admitting: Diagnostic Neuroimaging

## 2021-11-24 ENCOUNTER — Encounter: Payer: Self-pay | Admitting: Diagnostic Neuroimaging

## 2021-11-24 VITALS — BP 142/84 | HR 88 | Ht 63.0 in | Wt 224.0 lb

## 2021-11-24 DIAGNOSIS — G932 Benign intracranial hypertension: Secondary | ICD-10-CM

## 2021-11-24 DIAGNOSIS — F419 Anxiety disorder, unspecified: Secondary | ICD-10-CM | POA: Diagnosis not present
